# Patient Record
Sex: Male | Born: 1965 | Race: Black or African American | Hispanic: No | Marital: Married | Smoking: Never smoker
Health system: Southern US, Community
[De-identification: ages and names within clinical notes are randomized; demographics above are authoritative.]

## PROBLEM LIST (undated history)

## (undated) DIAGNOSIS — F32A Depression, unspecified: Secondary | ICD-10-CM

## (undated) DIAGNOSIS — R319 Hematuria, unspecified: Secondary | ICD-10-CM

## (undated) DIAGNOSIS — F329 Major depressive disorder, single episode, unspecified: Secondary | ICD-10-CM

## (undated) DIAGNOSIS — D649 Anemia, unspecified: Secondary | ICD-10-CM

## (undated) DIAGNOSIS — N200 Calculus of kidney: Secondary | ICD-10-CM

## (undated) HISTORY — DX: Anemia, unspecified: D64.9

## (undated) HISTORY — DX: Hematuria, unspecified: R31.9

## (undated) HISTORY — DX: Major depressive disorder, single episode, unspecified: F32.9

## (undated) HISTORY — DX: Depression, unspecified: F32.A

## (undated) HISTORY — PX: CHOLECYSTECTOMY: SHX55

## (undated) HISTORY — DX: Calculus of kidney: N20.0

---

## 1998-06-25 ENCOUNTER — Emergency Department (HOSPITAL_COMMUNITY): Admission: EM | Admit: 1998-06-25 | Discharge: 1998-06-25 | Payer: Self-pay | Admitting: Emergency Medicine

## 2000-11-11 ENCOUNTER — Emergency Department (HOSPITAL_COMMUNITY): Admission: EM | Admit: 2000-11-11 | Discharge: 2000-11-11 | Payer: Self-pay | Admitting: Emergency Medicine

## 2002-01-26 ENCOUNTER — Emergency Department (HOSPITAL_COMMUNITY): Admission: EM | Admit: 2002-01-26 | Discharge: 2002-01-26 | Payer: Self-pay | Admitting: Emergency Medicine

## 2005-01-07 ENCOUNTER — Emergency Department (HOSPITAL_COMMUNITY): Admission: EM | Admit: 2005-01-07 | Discharge: 2005-01-07 | Payer: Self-pay | Admitting: Emergency Medicine

## 2007-01-16 ENCOUNTER — Ambulatory Visit: Payer: Self-pay | Admitting: Internal Medicine

## 2007-01-16 DIAGNOSIS — F528 Other sexual dysfunction not due to a substance or known physiological condition: Secondary | ICD-10-CM | POA: Insufficient documentation

## 2007-01-20 LAB — CONVERTED CEMR LAB
ALT: 22 units/L (ref 0–53)
AST: 25 units/L (ref 0–37)
Albumin: 3.9 g/dL (ref 3.5–5.2)
Alkaline Phosphatase: 66 units/L (ref 39–117)
BUN: 7 mg/dL (ref 6–23)
Basophils Absolute: 0 10*3/uL (ref 0.0–0.1)
Basophils Relative: 0.4 % (ref 0.0–1.0)
Bilirubin, Direct: 0.3 mg/dL (ref 0.0–0.3)
CO2: 33 meq/L — ABNORMAL HIGH (ref 19–32)
Calcium: 9.7 mg/dL (ref 8.4–10.5)
Chloride: 101 meq/L (ref 96–112)
Cholesterol: 148 mg/dL (ref 0–200)
Creatinine, Ser: 0.8 mg/dL (ref 0.4–1.5)
Eosinophils Absolute: 0.2 10*3/uL (ref 0.0–0.6)
Eosinophils Relative: 3 % (ref 0.0–5.0)
GFR calc Af Amer: 138 mL/min
GFR calc non Af Amer: 114 mL/min
Glucose, Bld: 94 mg/dL (ref 70–99)
H Pylori IgG: POSITIVE — AB
HCT: 42.2 % (ref 39.0–52.0)
HDL: 40.2 mg/dL (ref >39.0)
Hemoglobin: 15.1 g/dL (ref 13.0–17.0)
LDL Cholesterol: 82 mg/dL (ref 0–99)
Lymphocytes Relative: 32 % (ref 12.0–46.0)
MCHC: 35.8 g/dL (ref 30.0–36.0)
MCV: 90.6 fL (ref 78.0–100.0)
Monocytes Absolute: 0.6 10*3/uL (ref 0.2–0.7)
Monocytes Relative: 8.7 % (ref 3.0–11.0)
Neutro Abs: 3.8 10*3/uL (ref 1.4–7.7)
Neutrophils Relative %: 55.9 % (ref 43.0–77.0)
Platelets: 464 10*3/uL — ABNORMAL HIGH (ref 150–400)
Potassium: 4.1 meq/L (ref 3.5–5.1)
RBC: 4.66 M/uL (ref 4.22–5.81)
RDW: 12.4 % (ref 11.5–14.6)
Sodium: 139 meq/L (ref 135–145)
Testosterone: 618.88 ng/dL (ref 350.00–890)
Total Bilirubin: 1.6 mg/dL — ABNORMAL HIGH (ref 0.3–1.2)
Total CHOL/HDL Ratio: 3.7
Total Protein: 6.5 g/dL (ref 6.0–8.3)
Triglycerides: 131 mg/dL (ref 0–149)
VLDL: 26 mg/dL (ref 0–40)
WBC: 6.8 10*3/uL (ref 4.5–10.5)

## 2008-01-21 ENCOUNTER — Ambulatory Visit: Payer: Self-pay | Admitting: Internal Medicine

## 2009-08-28 ENCOUNTER — Encounter: Payer: Self-pay | Admitting: Internal Medicine

## 2009-08-29 ENCOUNTER — Ambulatory Visit: Payer: Self-pay | Admitting: Internal Medicine

## 2009-08-29 DIAGNOSIS — R319 Hematuria, unspecified: Secondary | ICD-10-CM

## 2009-08-29 HISTORY — DX: Hematuria, unspecified: R31.9

## 2009-08-29 LAB — CONVERTED CEMR LAB
ALT: 32 units/L (ref 0–53)
Albumin: 4.1 g/dL (ref 3.5–5.2)
Basophils Absolute: 0 10*3/uL (ref 0.0–0.1)
Calcium: 9.1 mg/dL (ref 8.4–10.5)
Eosinophils Relative: 2.4 % (ref 0.0–5.0)
GFR calc non Af Amer: 119.23 mL/min (ref >60)
HCT: 40.3 % (ref 39.0–52.0)
Hemoglobin: 14.3 g/dL (ref 13.0–17.0)
Lymphocytes Relative: 37.1 % (ref 12.0–46.0)
MCV: 90.9 fL (ref 78.0–100.0)
Monocytes Relative: 8.5 % (ref 3.0–12.0)
Neutrophils Relative %: 51.5 % (ref 43.0–77.0)
Potassium: 4.3 meq/L (ref 3.5–5.1)
RBC: 4.43 M/uL (ref 4.22–5.81)
RDW: 13 % (ref 11.5–14.6)
Total Bilirubin: 1.2 mg/dL (ref 0.3–1.2)
WBC: 4.8 10*3/uL (ref 4.5–10.5)

## 2009-08-31 ENCOUNTER — Ambulatory Visit: Payer: Self-pay | Admitting: Cardiology

## 2009-09-07 ENCOUNTER — Ambulatory Visit: Payer: Self-pay | Admitting: Internal Medicine

## 2009-09-07 LAB — CONVERTED CEMR LAB
Cholesterol: 187 mg/dL (ref 0–200)
LDL Cholesterol: 134 mg/dL — ABNORMAL HIGH (ref 0–99)
Total CHOL/HDL Ratio: 4

## 2009-09-08 LAB — CONVERTED CEMR LAB

## 2009-10-13 ENCOUNTER — Ambulatory Visit: Payer: Self-pay | Admitting: Internal Medicine

## 2009-10-17 ENCOUNTER — Encounter (INDEPENDENT_AMBULATORY_CARE_PROVIDER_SITE_OTHER): Payer: Self-pay | Admitting: *Deleted

## 2009-10-25 ENCOUNTER — Telehealth: Payer: Self-pay | Admitting: Internal Medicine

## 2009-10-27 ENCOUNTER — Ambulatory Visit: Payer: Self-pay | Admitting: Gastroenterology

## 2009-10-27 DIAGNOSIS — F329 Major depressive disorder, single episode, unspecified: Secondary | ICD-10-CM

## 2009-11-03 ENCOUNTER — Telehealth: Payer: Self-pay | Admitting: Gastroenterology

## 2009-11-06 ENCOUNTER — Ambulatory Visit: Payer: Self-pay | Admitting: Gastroenterology

## 2009-11-07 LAB — HM COLONOSCOPY

## 2009-11-10 ENCOUNTER — Telehealth: Payer: Self-pay | Admitting: Gastroenterology

## 2010-01-12 ENCOUNTER — Telehealth: Payer: Self-pay | Admitting: Internal Medicine

## 2010-01-16 ENCOUNTER — Ambulatory Visit: Payer: Self-pay | Admitting: Internal Medicine

## 2010-02-06 ENCOUNTER — Ambulatory Visit: Payer: Self-pay | Admitting: Psychology

## 2010-03-02 ENCOUNTER — Ambulatory Visit: Payer: Self-pay | Admitting: Internal Medicine

## 2010-03-09 ENCOUNTER — Ambulatory Visit: Payer: Self-pay | Admitting: Psychology

## 2010-04-19 ENCOUNTER — Emergency Department (HOSPITAL_COMMUNITY): Admission: EM | Admit: 2010-04-19 | Discharge: 2009-12-22 | Payer: Self-pay | Admitting: Emergency Medicine

## 2010-05-26 ENCOUNTER — Emergency Department (HOSPITAL_COMMUNITY)
Admission: EM | Admit: 2010-05-26 | Discharge: 2010-05-26 | Payer: Self-pay | Source: Home / Self Care | Admitting: *Deleted

## 2010-06-14 NOTE — Progress Notes (Signed)
Summary: Procedure Mon question about prep  Phone Note Call from Patient Call back at [phone]   Call For: Dr Christella Hartigan Summary of Call: Question about Moviprep  Initial call taken by: Leanor Kail [facility],  November 03, 2009 1:48 PM  Follow-up for Phone Call        Pt asking why he has only one bottle with his prep-- states he has 2 pkts A and B but only one bottle to mix it in-- intructed thats correct-- mix one A and one B in the bottle and drink starting at 5 pm sunday and then mix one A and B in the same empty bottle monday starting at 10 am.... both to be followed by 16 oz fluid but nothing red or purple..... 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

## 2010-06-14 NOTE — Procedures (Signed)
Summary: Upper Endoscopy  Patient: Schneider Warchol Note: All result statuses are Final unless otherwise noted.  Tests: (1) Upper Endoscopy (EGD)   EGD Upper Endoscopy       DONE     [facility]     [address]     [place], [place]  [address]           ENDOSCOPY PROCEDURE REPORT           PATIENT:  Clee, Pandit  MR#:  [id number]     BIRTHDATE:  Dec 31, 1965, 43 yrs. old  GENDER:  male     ENDOSCOPIST:  Rachael Fee, MD     PROCEDURE DATE:  11/06/2009     PROCEDURE:  EGD, diagnostic     ASA CLASS:  Class II     INDICATIONS:  dyspepsia     MEDICATIONS:  There was residual sedation effect present from     prior procedure., Versed 2 mg IV     TOPICAL ANESTHETIC:  Exactacain Spray     DESCRIPTION OF PROCEDURE:   After the risks benefits and     alternatives of the procedure were thoroughly explained, informed     consent was obtained.  The LB [id number]-H180 [id number] endoscope was     introduced through the mouth and advanced to the second portion of     the duodenum, without limitations.  The instrument was slowly     withdrawn as the mucosa was fully examined.     <<PROCEDUREIMAGES>>     The upper, middle, and distal third of the esophagus were     carefully inspected and no abnormalities were noted. The z-line     was well seen at the GEJ. The endoscope was pushed into the fundus     which was normal including a retroflexed view. The antrum,gastric     body, first and second part of the duodenum were unremarkable (see     image1, image2, image3, image4, and image6).    Retroflexed views     revealed no abnormalities.    The scope was then withdrawn from     the patient and the procedure completed.           COMPLICATIONS:  None           ENDOSCOPIC IMPRESSION:     1) Normal EGD     2) Symptoms are likely from GERD +/- non-ulcer dyspepsia.  These     have clearly improved on twice daily nexium and were improving on     once dialy prilosec.           RECOMMENDATIONS:  A prescription for nexium was called in. Take one pill 20-30 min     before breakfast or dinner meal, daily. After 3-4 months, try     cutting back to one pill every other day.     Call Dr. Christella Hartigan' office if symptoms return.           ______________________________     Rachael Fee, MD           cc: Birdie Sons, MD           n.     eSIGNED:   Rachael Fee at 11/06/2009 03:21 PM           Mardelle Matte, [id number]  Note: An exclamation mark (!) indicates a result that was not dispersed into the flowsheet. Document Creation Date: 11/06/2009 3:21 PM _______________________________________________________________________  (1) Order  result status: Final Collection or observation date-time: 11/06/2009 15:13 Requested date-time:  Receipt date-time:  Reported date-time:  Referring Physician:   Ordering Physician: Rob Bunting [phone]) Specimen Source:  Source: Launa Grill Order Number: [id number] Lab site:   Appended Document: Upper Endoscopy    Clinical Lists Changes

## 2010-06-14 NOTE — Assessment & Plan Note (Signed)
Summary: severe abdominal pain and rectal bleeding x 1 week/pl   History of Present Illness Visit Type: Initial Visit Primary GI MD: Rob Bunting MD Primary Provider: Birdie Sons, MD Chief Complaint: Patient c/o 2-3 weeks brbpr with bowel movements as well as blood in the stool. He also c/o frequent generalized sharp abdominal pain. He has some constipation but states this has gotten better since beginning stool softeners. He denies any upper GI symptoms. History of Present Illness:   Terry Tanner 45 YO MALE NEW TO GI TODAY. HE COMES IN WITH C/O SHARP UPPER ABDOMINAL PAINS OVER THE PAST 2 MONTHS. HE HAS BEEN SEEN BY DR. Nelson Chimes HAVE BEEN UNREMARKABLE,AND CT ABD/PELVIS 421/11  SHOWED TINY HYPODENSITIES IN THE LEFT KIDNEY,ONE HYPODENSE STRUCTURE IN THE RIGHT LIVER TOO SMALL TO CHARACTERIZE AND A 3.6 MM PULMONARY NODULE. HE HAS BEEN TAKING PRILOSEC ONC EDAILY WITH CHANGE IN SXS. HE SAYS EATING WILL HELP FOR A LITTLE WHILE THEN THE PAIN COMES RIGHT BACK. NO N/V,WEIGHT DOWN A FEW POUNDS. NO FEVERS. HE IS S/P REMOTE CHOLECYSTECTOMY. NO HEARBURN,DYSPHAGIA. NO ASA/NSAID USE, NO ETOH. HE HAS ALSO BEEN SEEING BRB WITH BM'-BOTH IN THE COMMODE AND MIXED IN WITH HIS STOOLS. NO RECTAL PAIN,HAS HAD AN EXTERNAL HEMORRHOID. BM'S FAIRLY REGULAR. HE HAS BEEN SEEING BLOOD FOR 2 MONTHS AS WELL.   GI Review of Systems    Reports abdominal pain, bloating, and  weight loss.     Location of  Abdominal pain: upper abdomen. Weight loss of FEW  pounds   Denies acid reflux, chest pain, dysphagia with liquids, dysphagia with solids, heartburn, loss of appetite, nausea, vomiting, and  vomiting blood.      Reports change in bowel habits and  rectal bleeding.     Denies anal fissure, black tarry stools, constipation, diarrhea, diverticulosis, fecal incontinence, heme positive stool, hemorrhoids, irritable bowel syndrome, jaundice, light color stool, liver problems, and  rectal pain. Preventive Screening-Counseling &  Management  Caffeine-Diet-Exercise     Does Patient Exercise: no      Drug Use:  no.      Current Medications (verified): 1)  Omeprazole 20 Mg Cpdr (Omeprazole) .... 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  Drug Use:  no Does Patient Exercise:  no  Review of Systems       The patient complains of confusion, depression-new, sleeping problems, and urination - excessive.  The patient denies allergy/sinus, anemia, anxiety-new, arthritis/joint pain, back pain, blood in urine, breast changes/lumps, change in vision, cough, coughing up blood, fainting, fatigue, fever, headaches-new, hearing problems, heart murmur, heart rhythm changes, itching, menstrual pain, muscle pains/cramps, night sweats, nosebleeds, pregnancy symptoms, shortness of breath, skin rash, sore throat, swelling of feet/legs, swollen lymph glands, thirst - excessive , urination - excessive , urine leakage, vision changes, and voice change.         ROS OTHERWISE AS IN HPI  Vital Signs:  Patient profile:   45 year old male Height:      67 inches Weight:      159.38 pounds  Vitals Entered By: Lamona Curl  CMA Duncan Dull) (October 27, 2009 9:34 AM)  Physical Exam  General:  Well developed, well nourished, no acute distress. Head:  Normocephalic and atraumatic. Eyes:  PERRLA, no icterus. Lungs:  Clear throughout to auscultation. Heart:  Regular rate and rhythm; no murmurs, rubs,  or bruits. Abdomen:  SOFT, TENDER  EPIGASTRIUM AND MID ABDOMEN, NO GUARDING, NO REBOUND, NO MASS OR HSM,BS+ Rectal:  EXTERNAL NONINFLAMED HEMORRHOID,STOOL BROWN, HEME NEGATIVE CURRENTLY. Extremities:  No clubbing, cyanosis, edema or deformities noted. Neurologic:  Alert and  oriented x4;  grossly normal neurologically. Psych:  Alert and cooperative. Normal mood and affect.   Impression & Recommendations:  Problem # 1:  ABDOMINAL PAIN, EPIGASTRIC (ICD-789.06) Assessment New 45 YO MALE WITH 2 MONTH HX OF UPPER ABDOMINAL PAINS. PT IS S/P CHOLECYSTECTOMY-RECENT NORMAL LABS AND CT. R/O PUD/GASTROPATHY,H.PYLORI.  HE ALSO HAS BRB PER RECTUM /MIXED WITH STOOL X 2 MONTHS. R/O HEMORHOIDAL,R/O OCCULT LESION./R/O IBD   TRIAL OF NEXIUM 40 MG TWICE DAILY SCHEDULE FOR EGD AND COLONOSCOPY WITH DR. Christella Hartigan. PROCEDURES DISCUSSED IN DETAIL WITH PT AND HE IS AGREEABLE. BLAND DIET Orders: Colon/Endo (Colon/Endo)  Problem # 2:  RECTAL BLEEDING (ICD-569.3) Assessment: Comment Only SEE ABOVE Orders: Colon/Endo (Colon/Endo)  Problem # 3:  PULMONARY NODULE Assessment: Comment Only FOLLOW UP DEFERRED TO PRIMARY MD  Patient Instructions: 1)  We scheduled the procedure with Dr. Christella Hartigan on 11-06-09;. 2)  [facility] Patient Information Guide given to patient. 3)  Directions and brochure given. 4)  We have given you samples of Nexium 40 mg, take 1 cap 30 min prior to breakfast.   5)  [facility] [address] 6)  Copy sent to : Birdie Sons, MD 7)  The medication list was reviewed and reconciled.  All changed / newly prescribed medications were explained.  A complete medication list was provided to the patient / caregiver. Prescriptions: MOVIPREP 100 GM  SOLR (PEG-KCL-NACL-NASULF-NA ASC-C) As per prep instructions.  #1 x 0   Entered by:   Lowry Ram NCMA   Authorized by:   Sammuel Cooper PA-c   Signed by:   Lowry Ram NCMA on 10/27/2009   Method used:   [facility] to        [facility]  [facility] [phone]* (retail)       [address] [place], [place]  [address]       Ph: [phone]       Fax: [phone]   RxID:   [id number]

## 2010-06-14 NOTE — Assessment & Plan Note (Signed)
Summary: 6 wk rov/njr   Vital Signs:  Patient profile:   45 year old male Weight:      162 pounds Temp:     98 degrees F oral Pulse rate:   78 / minute Pulse rhythm:   regular Resp:     12 per minute BP sitting:   118 / 90  Vitals Entered By: Lynann Beaver CMA AAMA (March 02, 2010 8:12 AM) CC: rov Is Patient Diabetic? No Pain Assessment Patient in pain? no        Primary Care Provider:  Birdie Sons, MD  CC:  rov.  History of Present Illness: depression: much improved. not quite as energetic as he would like. he is able to handle stressors better he is here with wife---she reports the same he feels that he is thinking "sharper"  no side effects on meds  Current Problems (verified): 1)  Depression  (ICD-311) 2)  Preventive Health Care  (ICD-V70.0) 3)  Hematuria  (ICD-599.70) 4)  Erectile Dysfunction  (ICD-302.72)  Current Medications (verified): 1)  Citalopram Hydrobromide 20 Mg Tabs (Citalopram Hydrobromide) .... 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  no illicit drugs  Risk Factors: Exercise: no (10/27/2009)  Risk Factors: Smoking Status: never (09/07/2009)  Physical Exam  General:  alert and well-developed.   Psych:  normally interactive, good eye contact, not anxious appearing, and not depressed appearing.     Impression &  Recommendations:  Problem # 1:  DEPRESSION (ICD-311) will increase meds side effects discussed see me 6 weeks His updated medication list for this problem includes:    Citalopram Hydrobromide 20 Mg Tabs (Citalopram hydrobromide) ..... One by mouth daily  Complete Medication List: 1)  Citalopram Hydrobromide 40 Mg Tabs (Citalopram hydrobromide) .... Take one tablet by mouth daily  Patient Instructions: 1)  see me 6 weeks  Prescriptions: CITALOPRAM HYDROBROMIDE 40 MG TABS (CITALOPRAM HYDROBROMIDE) take one tablet by mouth daily  #90 x 3   Entered and Authorized by:   Birdie Sons MD   Signed by:   Birdie Sons MD on 03/02/2010   Method used:   [phone]       [address]       [place], [place]  [address]       Ph: [phone] or [phone]       Fax: [phone]   RxID:   [id number] CITALOPRAM HYDROBROMIDE 40 MG TABS (CITALOPRAM HYDROBROMIDE) take one tablet by mouth daily  #90 x 3   Entered and Authorized by:   Birdie Sons MD   Signed by:   Birdie Sons MD on 03/02/2010   Method used:   Electronically to        [facility] # [phone]* (retail)       [address] [place], [place]  [address]       Ph: [phone]  Fax: [phone]   RxID:   [id number]    Orders Added: 1)  Est. Patient Level III [[address]]

## 2010-06-14 NOTE — Assessment & Plan Note (Signed)
Summary: cpx//ccm   Vital Signs:  Patient profile:   45 year old male Height:      67 inches Weight:      170 pounds BMI:     26.72 Pulse rate:   72 / minute Pulse rhythm:   regular Resp:     12 per minute BP sitting:   116 / 64  (left arm) Cuff size:   regular  Vitals Entered By: Gladis Riffle, RN (September 07, 2009 10:17 AM) CC: cpx, labs done--c/o stomach pains under diaphragm x 2-3 weeks--discuss BP, was 150/? at CT scan Is Patient Diabetic? No   CC:  cpx, labs done--c/o stomach pains under diaphragm x 2-3 weeks--discuss BP, and was 150/? at CT scan.  History of Present Illness: cpx note hematuria---urology  has some abdominal pain--ongoing for years nl BMs not affected by foods  Preventive Screening-Counseling & Management  Alcohol-Tobacco     Smoking Status: never  Current Medications (verified): 1)  None  Allergies (verified): No Known Drug Allergies  Family History: father healthy mother --- deceased cancer alcohol related (liver cancer)  Social History: Occupation: truck Hospital doctor  Never Smoked Alcohol use-no 4 year relationship 2 kids healthy  Physical Exam  General:  alert and well-developed.   Head:  normocephalic and atraumatic.   Eyes:  pupils equal and pupils round.   Ears:  R ear normal and L ear normal.   Nose:  no external deformity and no external erythema.   Mouth:  Oral mucosa and oropharynx without lesions or exudates.  Teeth in good repair. Neck:  supple, full ROM, and no masses.   Chest Wall:  no deformities and no tenderness.   Lungs:  Normal respiratory effort, chest expands symmetrically. Lungs are clear to auscultation, no crackles or wheezes. Heart:  normal rate and regular rhythm.   Abdomen:  Bowel sounds positive,abdomen soft and non-tender without masses, organomegaly or hernias noted. Msk:  No deformity or scoliosis noted of thoracic or lumbar spine.   Extremities:  No clubbing, cyanosis, edema, or deformity noted    Neurologic:  cranial nerves II-XII intact and gait normal.   Skin:  turgor normal and color normal.   Psych:  normally interactive and good eye contact.     Impression & Recommendations:  Problem # 1:  PREVENTIVE HEALTH CARE (ICD-V70.0)  Orders: Venipuncture ([address]) T-RPR (Syphilis) ([address]) T-HIV Antibody  (Reflex) ([address]78-29[address]) TLB-Lipid Panel (80061-LIPID)  Problem # 2:  HEMATURIA (ICD-599.70) to see urology  Problem # 3:  ABDOMINAL PAIN (ICD-789.00) ongoing problem triall ppi if no success then GI appt  Complete Medication List: 1)  Omeprazole 20 Mg Cpdr (Omeprazole) .... One by mouth daily  Other Orders: Tdap => 78yrs IM ([address]86) Admin 1st Vaccine ([address]) Prescriptions: OMEPRAZOLE 20 MG CPDR (OMEPRAZOLE) one by mouth daily  #30 x 3   Entered and Authorized by:   Birdie Sons MD   Signed by:   Birdie Sons MD on 09/07/2009   Method used:   [facility] to        [facility] [phone]* (retail)       [address] [place], [place]  [address]       Ph: [phone]       Fax: [phone]   RxID:   [id number] OMEPRAZOLE 20 MG CPDR (OMEPRAZOLE) one by mouth daily  #30 x 3   Entered and Authorized by:   Birdie Sons MD   Signed by:  Birdie Sons MD on 09/07/2009   Method used:   [facility]        [facility]  [address] [phone]* (retail)       [address]       [place], [place]  [address]       Ph: [phone] or [phone]       Fax: [phone]   RxID:   [id number]    Immunizations Administered:  Tetanus Vaccine:    Vaccine Type: Tdap    Site: left deltoid    Mfr: GlaxoSmithKline    Dose: 0.5 ml    Route: IM    Given by: Gladis Riffle, RN    Exp. Date: 08/05/2011    Lot #: [id number]

## 2010-06-14 NOTE — Miscellaneous (Signed)
Summary: rx  Clinical Lists Changes  Medications: Changed medication from NEXIUM 40 MG CPDR (ESOMEPRAZOLE MAGNESIUM) Take 1 cap 30 min prior to breakfast to NEXIUM 40 MG CPDR (ESOMEPRAZOLE MAGNESIUM) Take 1 cap 30 min prior to breakfast meal - Signed Rx of NEXIUM 40 MG CPDR (ESOMEPRAZOLE MAGNESIUM) Take 1 cap 30 min prior to breakfast meal;  #30 x 11;  Signed;  Entered by: Rachael Fee MD;  Authorized by: Rachael Fee MD;  Method used: [facility] # [phone]*, [address] [place], [place], [place]  [address], Ph: [phone], Fax: [phone]    Prescriptions: NEXIUM 40 MG CPDR (ESOMEPRAZOLE MAGNESIUM) Take 1 cap 30 min prior to breakfast meal  #30 x 11   Entered and Authorized by:   Rachael Fee MD   Signed by:   Rachael Fee MD on 11/06/2009   Method used:   Electronically to        [facility] [phone]* (retail)       [address] [place], [place]  [address]       Ph: [phone]       Fax: [phone]   RxID:   [id number]

## 2010-06-14 NOTE — Letter (Signed)
Summary: New Patient letter  [facility]  [address] [place], [place] [address]   Phone: [phone]  Fax: [phone]       10/17/2009 MRN: [id number]  Terry Tanner [address] [place], [place]  [address]  Dear Mr. Terry Tanner,  Welcome to the Gastroenterology Division at [facility].    You are scheduled to see Dr.  Leone Payor on 11-06-09 at 8:45am on the 3rd floor at [facility], [address]  We ask that you try to arrive at our office 15 minutes prior to your appointment time to allow for check-in.  We would like you to complete the enclosed self-administered evaluation form prior to your visit and bring it with you on the day of your appointment.  We will review it with you.  Also, please bring a complete list of all your medications or, if you prefer, bring the medication bottles and we will list them.  Please bring your insurance card so that we may make a copy of it.  If your insurance requires a referral to see a specialist, please bring your referral form from your primary care physician.  Co-payments are due at the time of your visit and may be paid by cash, check or credit card.     Your office visit will consist of a consult with your physician (includes a physical exam), any laboratory testing he/she may order, scheduling of any necessary diagnostic testing (e.g. x-ray, ultrasound, CT-scan), and scheduling of a procedure (e.g. Endoscopy, Colonoscopy) if required.  Please allow enough time on your schedule to allow for any/all of these possibilities.    If you cannot keep your appointment, please call [phone] to cancel or reschedule prior to your appointment date.  This allows [place] the opportunity to schedule an appointment for another patient in need of care.  If you do not cancel or reschedule by 5 p.m. the business day prior to your appointment date, you will be charged a $50.00 late cancellation/no-show fee.    Thank you for  choosing [facility] for your medical needs.  We appreciate the opportunity to care for you.  Please visit [place] at our website  to learn more about our practice.                     Sincerely,                                                             The Gastroenterology Division

## 2010-06-14 NOTE — Procedures (Signed)
Summary: Colonoscopy  Patient: Terry Tanner Note: All result statuses are Final unless otherwise noted.  Tests: (1) Colonoscopy (COL)   COL Colonoscopy           DONE     [facility]     [address]     [place], [place]  [address]           COLONOSCOPY PROCEDURE REPORT           PATIENT:  Terry Tanner, Terry Tanner  MR#:  [id number]     BIRTHDATE:  1966-01-22, 43 yrs. old  GENDER:  male     ENDOSCOPIST:  Rachael Fee, MD     REF. BY:  Birdie Sons, M.D.     PROCEDURE DATE:  11/06/2009     PROCEDURE:  Diagnostic Colonoscopy     ASA CLASS:  Class II     INDICATIONS:  rectal bleeding     MEDICATIONS:   Fentanyl 75 mcg IV, Versed 8 mg IV           DESCRIPTION OF PROCEDURE:   After the risks benefits and     alternatives of the procedure were thoroughly explained, informed     consent was obtained.  Digital rectal exam was performed and     revealed no rectal masses.   The LB [id number] [id number] endoscope     was introduced through the anus and advanced to the cecum, which     was identified by both the appendix and ileocecal valve, without     limitations.  The quality of the prep was good, using MoviPrep.     The instrument was then slowly withdrawn as the colon was fully     examined.     <<PROCEDUREIMAGES>>           FINDINGS:  External hemorrhoids were found. These were small,     non-thrombosed.  This was otherwise a normal examination of the     colon (see image1, image2, and image3).   Retroflexed views in the     rectum revealed no abnormalities.    The scope was then withdrawn     from the patient and the procedure completed.           COMPLICATIONS:  None           ENDOSCOPIC IMPRESSION:     1) External hemorrhoids; these are likely the source of recent     minor rectal bleeding     2) Otherwise normal examination; no polyps or cancers           RECOMMENDATIONS:     1) Continue current colorectal screening recommendations for     "routine risk" patients with a  repeat colonoscopy iat age 64.           REPEAT EXAM:  9 years (age 28)           ______________________________     Rachael Fee, MD           n.     eSIGNED:   Rachael Fee at 11/06/2009 03:08 PM           Mardelle Matte, [id number]  Note: An exclamation mark (!) indicates a result that was not dispersed into the flowsheet. Document Creation Date: 11/06/2009 3:09 PM _______________________________________________________________________  (1) Order result status: Final Collection or observation date-time: 11/06/2009 15:04 Requested date-time:  Receipt date-time:  Reported date-time:  Referring Physician:   Ordering Physician: Rob Bunting [id number]) Specimen  Source:  Source: Launa Grill Order Number: [id number] Lab site:   Appended Document: Colonoscopy     Procedures Next Due Date:    Colonoscopy: 11/2017

## 2010-06-14 NOTE — Letter (Signed)
Summary: [facility]  [facility]   Imported By: Maryln Gottron 08/30/2009 14:16:58  _____________________________________________________________________  External Attachment:    Type:   Image     Comment:   External Document

## 2010-06-14 NOTE — Assessment & Plan Note (Signed)
Summary: GI ISSUES OK PER ELLEN/SRC   Vital Signs:  Patient profile:   45 year old male Weight:      165 pounds Temp:     98.0 degrees F oral BP sitting:   120 / 70 CC: GI problems meds not working    CC:  GI problems meds not working .  History of Present Illness: still with some epigastric disomfort pain is constant food does not affect the discomfort BMs are normal Triall PPI without success sxs have been ongoing for 2-4 months.   All other systems reviewed and were negative   Current Medications (verified): 1)  Omeprazole 20 Mg Cpdr (Omeprazole) .... One By Mouth Daily  Allergies (verified): No Known Drug Allergies  Past History:  Past Medical History: Last updated: 01/16/2007 Unremarkable  Past Surgical History: Last updated: 01/16/2007 Cholecystectomy  Family History: Last updated: 09/07/2009 father healthy mother --- deceased cancer alcohol related (liver cancer)  Social History: Last updated: 09/07/2009 Occupation: truck driver  Never Smoked Alcohol use-no 4 year relationship 2 kids healthy  Risk Factors: Smoking Status: never (09/07/2009)  Physical Exam  General:  alert and well-developed.   Head:  normocephalic and atraumatic.   Eyes:  pupils equal and pupils round.   Ears:  R ear normal and L ear normal.   Neck:  supple, full ROM, and no masses.   Chest Wall:  no deformities and no tenderness.   Lungs:  Normal respiratory effort, chest expands symmetrically. Lungs are clear to auscultation, no crackles or wheezes. Heart:  normal rate and regular rhythm.   Abdomen:  Bowel sounds positive,abdomen soft and non-tender without masses, organomegaly or hernias noted. Msk:  No deformity or scoliosis noted of thoracic or lumbar spine.   Neurologic:  cranial nerves II-XII intact and gait normal.     Impression & Recommendations:  Problem # 1:  ABDOMINAL PAIN (ICD-789.00)  refer to GI--- probably needs colonoscopy  Orders: Venipuncture  ([address]) T-Sprue Panel (Celiac Disease Aby Eval) (83516x3/[address]) Gastroenterology Referral (GI) TLB-Sedimentation Rate (ESR) (85652-ESR)  Complete Medication List: 1)  Omeprazole 20 Mg Cpdr (Omeprazole) .... One by mouth daily

## 2010-06-14 NOTE — Progress Notes (Signed)
Summary: Triage  Phone Note Call from Patient Call back at Home Phone [phone]   Caller: spouse Saipine Call For: Dr. Leone Payor Summary of Call: pt has an appt. on 11-06-09 and requesting sooner appt. Pt is having severe abd. pain Initial call taken by: Karna Christmas,  October 25, 2009 3:35 PM  Follow-up for Phone Call        pt has had sever abd pain x 1 week with BRB on tissue and in the commode.  The pain has gotten worse.  Pt offered and accepted appt with Amy on Fri. Follow-up by: Chales Abrahams CMA Duncan Dull),  October 25, 2009 3:46 PM

## 2010-06-14 NOTE — Assessment & Plan Note (Signed)
Summary: ? possible urine issues per Dr Naomie Dean   Vital Signs:  Patient profile:   45 year old male Weight:      172 pounds BMI:     27.04 Temp:     98.2 degrees F oral Pulse rate:   84 / minute Pulse rhythm:   regular Resp:     12 per minute BP sitting:   104 / 72  (left arm) Cuff size:   regular  Vitals Entered By: Gladis Riffle, RN (August 29, 2009 10:20 AM) CC: at DOT physical was told has hematuria Is Patient Diabetic? No   CC:  at DOT physical was told has hematuria.  History of Present Illness: DOT physical noted blood in urine (reviewed note by Dr. Naomie Dean).  Pt notes fatigue and has had some vague discomfort of abdomen (described as bloating)---reviewed record. pt admits to daytime hypersomnolence. 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  he has never had gross hematuria.  All other systems reviewed and were negative   Preventive Screening-Counseling & Management  Alcohol-Tobacco     Smoking Status: never  Current Medications (verified): 1)  None  Allergies (verified): No Known Drug Allergies  Past History:  Past Medical History: Last updated: 01/16/2007 Unremarkable  Past Surgical History: Last updated: 01/16/2007 Cholecystectomy  Family History: Last updated: 01/16/2007 father healthy mother healthy  Social History: Last updated: 01/16/2007 Occupation: truck driver  Never Smoked Alcohol use-no 9 year relationship  Risk Factors: Smoking Status: never (08/29/2009)  Physical Exam  General:  alert and well-developed.   Head:  normocephalic and atraumatic.   Eyes:  pupils equal and pupils round.   Ears:  R ear normal and L ear normal.   Neck:  supple, full ROM, and no masses.   Chest Wall:  no deformities and no tenderness.   Lungs:  normal respiratory effort and no intercostal retractions.   Heart:  normal rate and regular rhythm.   Abdomen:  Bowel sounds positive,abdomen soft  and non-tender without masses, organomegaly or hernias noted. Msk:  No deformity or scoliosis noted of thoracic or lumbar spine.   Extremities:  No clubbing, cyanosis, edema, or deformity noted  Neurologic:  cranial nerves II-XII intact and gait normal.     Impression & Recommendations:  Problem # 1:  HEMATURIA (ICD-599.70) needs eval] check labs check CT abdomen and pelvis Orders: T-Culture, Urine ([id number]) Radiology Referral (Radiology) Venipuncture (57846) TLB-BMP (Basic Metabolic Panel-BMET) (80048-METABOL) TLB-CBC Platelet - w/Differential (85025-CBCD) TLB-TSH (Thyroid Stimulating Hormone) (84443-TSH) TLB-Hepatic/Liver Function Pnl (80076-HEPATIC)  Problem # 2:  ABDOMINAL PAIN (ICD-789.00) ? cause will evaluate as part of hematuria eval Orders: Radiology Referral (Radiology) Venipuncture [phone]) TLB-BMP (Basic Metabolic Panel-BMET) (80048-METABOL) TLB-CBC Platelet - w/Differential (85025-CBCD) TLB-TSH (Thyroid Stimulating Hormone) (84443-TSH) TLB-Hepatic/Liver Function Pnl (80076-HEPATIC)   Pt tells me that hysicians responsible for DOT physical have restricted him from working until evaluation complete (we will try to expedite eval, but DOT physicians will be responsible for writing notes to work, etc...)  Complete Medication List: 1)  None   Other Orders: UA Dipstick w/o Micro (automated)  (81003)  Appended Document: Orders Update    Clinical Lists Changes  Observations: Added new observation of COMMENTS: Wynona Canes, CMA  August 29, 2009 1:51 PM  (08/29/2009 13:51) Added new observation of PH URINE: 6.0  (08/29/2009 13:51) Added new observation of SPEC GR URIN: <1.005  (08/29/2009 13:51) Added new observation of APPEARANCE  U: Clear  (08/29/2009 13:51) Added new observation of UA COLOR: yellow  (08/29/2009 13:51) Added new observation of WBC DIPSTK U: negative  (08/29/2009 13:51) Added new observation of NITRITE URN: negative  (08/29/2009  13:51) Added new observation of UROBILINOGEN: 0.2  (08/29/2009 13:51) Added new observation of PROTEIN, URN: negative  (08/29/2009 13:51) Added new observation of BLOOD UR DIP: 2+  (08/29/2009 13:51) Added new observation of KETONES URN: negative  (08/29/2009 13:51) Added new observation of BILIRUBIN UR: negative  (08/29/2009 13:51) Added new observation of GLUCOSE, URN: negative  (08/29/2009 13:51)      Laboratory Results   Urine Tests  Date/Time Recieved: August 29, 2009 1:51 PM  Date/Time Reported: August 29, 2009 1:51 PM   Routine Urinalysis   Color: yellow Appearance: Clear Glucose: negative   (Normal Range: Negative) Bilirubin: negative   (Normal Range: Negative) Ketone: negative   (Normal Range: Negative) Spec. Gravity: <1.005   (Normal Range: 1.003-1.035) Blood: 2+   (Normal Range: Negative) pH: 6.0   (Normal Range: 5.0-8.0) Protein: negative   (Normal Range: Negative) Urobilinogen: 0.2   (Normal Range: 0-1) Nitrite: negative   (Normal Range: Negative) Leukocyte Esterace: negative   (Normal Range: Negative)    Comments: Wynona Canes, CMA  August 29, 2009 1:51 PM     Appended Document: ? possible urine issues per Dr Naomie Dean CT pending
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
## 2010-06-14 NOTE — Progress Notes (Signed)
Summary: triage  Phone Note Call from Patient Call back at [phone]   Caller: Patient Call For: Christella Hartigan Reason for Call: Talk to Nurse Summary of Call: Patient states that his hemorroids has flare up wants to know what to do. Initial call taken by: Tawni Levy,  November 10, 2009 9:14 AM  Follow-up for Phone Call        Message left to call back.   Teryl Lucy RN  November 10, 2009 9:22 AM Hemmorrhoids are painful.No rectal bleeding.Advised to use fiber supplement daily.Requesting med. as he tried otc. cream and it has not helped. Follow-up by: Teryl Lucy RN,  November 10, 2009 9:31 AM  Additional Follow-up for Phone Call Additional follow up Details #1::        please call him in analpram ointment, apply twice daily while hemorrhoids are painful, disp 1 large tube with 4 refills.  he should call if this is not helpful Additional Follow-up by: Rachael Fee MD,  November 10, 2009 9:45 AM    Additional Follow-up for Phone Call Additional follow up Details #2::    Rx. sent to pharmacy Follow-up by: Teryl Lucy RN,  November 10, 2009 10:04 AM  New/Updated Medications: ANALPRAM-HC 1-2.5 % CREA (HYDROCORTISONE ACE-PRAMOXINE) Apply topically to rectum b.i.d. when hemorrhoids painful Prescriptions: ANALPRAM-HC 1-2.5 % CREA (HYDROCORTISONE ACE-PRAMOXINE) Apply topically to rectum b.i.d. when hemorrhoids painful  #30 gm. tube x 3   Entered by:   Teryl Lucy RN   Authorized by:   Rachael Fee MD   Signed by:   Teryl Lucy RN on 11/10/2009   Method used:   [facility]ron[facility] to        [phone][facility] (retail)       [address] [place], [place]  [address]       Ph: [phone]       Fax: [phone]   RxID:   [id number]

## 2010-06-14 NOTE — Letter (Signed)
Summary: [facility] Instructions  [facility]  [address] [place], [place] [address]   Phone: [phone]  Fax: [phone]       Terry Tanner    06/03/1965    MRN: [id number]        Procedure Day /Date: 11-06-09     Arrival Time: 2:00 PM      Procedure Time: 3:00 PM     Location of Procedure:                    X      [facility] (4th Floor)                       PREPARATION FOR COLONOSCOPY WITH MOVIPREP   Starting 5 days prior to your procedure 11-01-09 do not eat nuts, seeds, popcorn, corn, beans, peas,  salads, or any raw vegetables.  Do not take any fiber supplements (e.g. Metamucil, Citrucel, and Benefiber).  THE DAY BEFORE YOUR PROCEDURE         DATE: 11-05-09 DAY: Sunday  1.  Drink clear liquids the entire day-NO SOLID FOOD  2.  Do not drink anything colored red or purple.  Avoid juices with pulp.  No orange juice.  3.  Drink at least 64 oz. (8 glasses) of fluid/clear liquids during the day to prevent dehydration and help the prep work efficiently.  CLEAR LIQUIDS INCLUDE: Water Jello Ice Popsicles Tea (sugar ok, no milk/cream) Powdered fruit flavored drinks Coffee (sugar ok, no milk/cream) Gatorade Juice: apple, white grape, white cranberry  Lemonade Clear bullion, consomm, broth Carbonated beverages (any kind) Strained chicken noodle soup Hard Candy                             4.  In the morning, mix first dose of MoviPrep solution:    Empty 1 Pouch A and 1 Pouch B into the disposable container    Add lukewarm drinking water to the top line of the container. Mix to dissolve    Refrigerate (mixed solution should be used within 24 hrs)  5.  Begin drinking the prep at 5:00 p.m. The MoviPrep container is divided by 4 marks.   Every 15 minutes drink the solution down to the next mark (approximately 8 oz) until the full liter is complete.   6.  Follow completed prep with 16 oz of clear liquid of your choice (Nothing red or  purple).  Continue to drink clear liquids until bedtime.  7.  Before going to bed, mix second dose of MoviPrep solution:    Empty 1 Pouch A and 1 Pouch B into the disposable container    Add lukewarm drinking water to the top line of the container. Mix to dissolve    Refrigerate  THE DAY OF YOUR PROCEDURE      DATE: 11-06-09 DAY: Monday  Beginning at 10:00 AM  (5 hours before procedure):         1. Every 15 minutes, drink the solution down to the next mark (approx 8 oz) until the full liter is complete.  2. Follow completed prep with 16 oz. of clear liquid of your choice.    3. You may drink clear liquids until 1:00 PM (2 HOURS BEFORE PROCEDURE).   MEDICATION INSTRUCTIONS  Unless otherwise instructed, you should take regular prescription medications with a small sip of water   as early as possible the  morning of your procedure.      OTHER INSTRUCTIONS  You will need a responsible adult at least 43 years of age to accompany you and drive you home.   This person must remain in the waiting room during your procedure.  Wear loose fitting clothing that is easily removed.  Leave jewelry and other valuables at home.  However, you may wish to bring a book to read or  an iPod/MP3 player to listen to music as you wait for your procedure to start.  Remove all body piercing jewelry and leave at home.  Total time from sign-in until discharge is approximately 2-3 hours.  You should go home directly after your procedure and rest.  You can resume normal activities the  day after your procedure.  The day of your procedure you should not:   Drive   Make legal decisions   Operate machinery   Drink alcohol   Return to work  You will receive specific instructions about eating, activities and medications before you leave.    The above instructions have been reviewed and explained to me by   _______________________    I fully understand and can verbalize these instructions  _____________________________ Date _________
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
## 2010-09-19 ENCOUNTER — Ambulatory Visit (INDEPENDENT_AMBULATORY_CARE_PROVIDER_SITE_OTHER): Payer: BC Managed Care – PPO | Admitting: Internal Medicine

## 2010-09-19 ENCOUNTER — Encounter: Payer: Self-pay | Admitting: Internal Medicine

## 2010-09-19 DIAGNOSIS — R635 Abnormal weight gain: Secondary | ICD-10-CM | POA: Insufficient documentation

## 2010-09-19 DIAGNOSIS — L0390 Cellulitis, unspecified: Secondary | ICD-10-CM

## 2010-09-19 DIAGNOSIS — D649 Anemia, unspecified: Secondary | ICD-10-CM

## 2010-09-19 DIAGNOSIS — L0291 Cutaneous abscess, unspecified: Secondary | ICD-10-CM

## 2010-09-19 DIAGNOSIS — R109 Unspecified abdominal pain: Secondary | ICD-10-CM

## 2010-09-19 HISTORY — DX: Anemia, unspecified: D64.9

## 2010-09-19 LAB — CBC WITH DIFFERENTIAL/PLATELET
Basophils Absolute: 0 10*3/uL (ref 0.0–0.1)
HCT: 40 % (ref 39.0–52.0)
Lymphs Abs: 1.6 10*3/uL (ref 0.7–4.0)
Monocytes Relative: 7.9 % (ref 3.0–12.0)
Neutrophils Relative %: 69.5 % (ref 43.0–77.0)
Platelets: 371 10*3/uL (ref 150.0–400.0)
RDW: 13.8 % (ref 11.5–14.6)

## 2010-09-19 LAB — TSH: TSH: 1.01 u[IU]/mL (ref 0.35–5.50)

## 2010-09-19 LAB — H. PYLORI ANTIBODY, IGG: H Pylori IgG: NEGATIVE

## 2010-09-19 MED ORDER — ESOMEPRAZOLE MAGNESIUM 40 MG PO CPDR: 40.0000 mg | DELAYED_RELEASE_CAPSULE | Freq: Every day | ORAL | Status: DC

## 2010-09-19 NOTE — Assessment & Plan Note (Signed)
Reassuring past workup. Obtain repeat CBC, h. Pylori ab, and begin nexium 40mg  po qd. Followup if no improvement or worsening.

## 2010-09-19 NOTE — Assessment & Plan Note (Signed)
Recommend continuation of abx prescribed by UC. Pt plans to call UC for cx results. If worsens or does not improve then followup closely for consideration of I&D.

## 2010-09-19 NOTE — Assessment & Plan Note (Signed)
Obtain TSH. Consider effect from ssri.

## 2010-09-19 NOTE — Progress Notes (Signed)
  Subjective:    Patient ID: Terry Tanner, male    DOB: December 25, 1965, 45 y.o.   MRN: [id number]  HPI Pt presents to clinic for evaluation of abdominal pain and skin abscess. This recent onset of periumbilical abdominal pain without radiation. No nausea vomiting or change in bowel habits. Has attempted no medications. Previously took Nexium but has been out of medication for one year. No exacerbating or alleviating factors and no relationship to food. Is status post cholecystectomy and underwent abdominal CT April 2011, EGD June 2011-normal and colonoscopy June 2011 with external hemorrhoids noted. Notes associated bloating. Also recently seen three days ago at urgent care for left lower flank skin abscess.  Was placed on unspecified antibiotic. Swab culture was obtained with the current results. Examiner informed patient the area appeared to be likely MRSA. No history of past MRSA or obvious exposure. No fever chills or drainage from the area.Notes unintended wt gain. Does take ssri. Also at [facility] had CBC reviewed with hgb 13.4 (14.1-18.1) No other complaints.  Reviewed past medical history, medications and allergies.  Review of Systems  Constitutional: Positive for unexpected weight change. Negative for fever and chills.  Gastrointestinal: Positive for abdominal pain and abdominal distention. Negative for nausea, vomiting, diarrhea, blood in stool and anal bleeding.  Skin: Positive for color change and wound. Negative for rash.       Objective:   Physical Exam  [nursing notereviewed. Constitutional: He appears well-developed and well-nourished. No distress.  HENT:  Head: Normocephalic and atraumatic.  Right Ear: External ear normal.  Left Ear: External ear normal.  Eyes: Conjunctivae are normal. No scleral icterus.  Abdominal: Soft. He exhibits no distension and no mass. There is no hepatosplenomegaly. There is tenderness in the epigastric area. There is no rebound and no guarding.  Skin: Skin is  warm and dry. No bruising, no burn and no rash noted. He is not diaphoretic. No cyanosis. Nails show no clubbing.          Left lateral flank: 1/2 cm papule. +tender. No expressible discharge. Deeper palpation suggests induration/thickening vs fluid collection under papule ~2cm x 2cm          Assessment & Plan:

## 2010-09-20 ENCOUNTER — Telehealth: Payer: Self-pay

## 2010-09-20 NOTE — Telephone Encounter (Signed)
Message copied by Kyung Rudd on Thu Sep 20, 2010  4:01 PM ------      Message from: Letitia Libra, Maisie Fus      Created: Wed Sep 19, 2010  4:59 PM       Cbc nl. Not anemic. h pylori test neg. Thyroid nl

## 2010-09-21 ENCOUNTER — Telehealth: Payer: Self-pay

## 2010-09-21 NOTE — Telephone Encounter (Signed)
Pt aware and labs mailed, per pt request

## 2010-09-21 NOTE — Telephone Encounter (Signed)
Message copied by Kyung Rudd on Fri Sep 21, 2010  3:22 PM ------      Message from: Letitia Libra, Maisie Fus      Created: Wed Sep 19, 2010  4:59 PM       Cbc nl. Not anemic. h pylori test neg. Thyroid nl

## 2010-09-21 NOTE — Telephone Encounter (Signed)
Pt aware. Per pt request, lab results mailed

## 2010-09-28 NOTE — Assessment & Plan Note (Signed)
[facility]                                 ON-CALL NOTE   JAX, KENTNER                       MRN:          [id number]  DATE:01/20/2007                            DOB:          10/29/1965    A phone call came from Diane with [facility] at [phone].  I wound up speaking  to the pharmacist.  I do not remember his name.  A phone call came at  5:07 p.m. on September 9th.  Mr. Flahive, I guess, was diagnosed with an  H. Pylori infection, and they phoned in a Prevpac for him, but the  directions were one daily for 10 days.  The pharmacist calls because it  is generally a pack for 14 days, with morning pills and evening pills  with the amoxicillin, Biaxin and Prevacid.   PLAN:  I told the pharmacist to go ahead and fill the 14 days of the  Prevpac and to have the patient take it in the usual fashion.  After  that, the patient got on the line, wanted to ask me whether this was  considered transmissible, as his partner has been having some problems.  She had had a little bit of cramps, as well.  I told him that it  generally is not transmissible by sexual or other household means, in a  way that we understand.  I also confirmed for him that it was not in any  way sexually transmitted.     Karie Schwalbe, MD  Electronically Signed    RIL/MedQ  DD: 01/20/2007  DT: 01/21/2007  Job #: [id number]   cc:   Valetta Mole. Swords, MD

## 2010-10-16 ENCOUNTER — Telehealth: Payer: Self-pay | Admitting: Gastroenterology

## 2010-10-16 ENCOUNTER — Other Ambulatory Visit: Payer: Self-pay

## 2010-10-16 NOTE — Telephone Encounter (Signed)
Pt is complaining of sharp abd pain in the center of the abd.  He has bloating and hard stool.  He is taking Nexium AFTER breakfast and I did advise him to take it 20-30 min BEFORE breakfast.  Pt saw Dr Rodena Medin on 09/19/10 and labs were done.  TSH, CBC, and H pylori normal.  Pt has possible MRSA and was put on antibiotic.  CT scan done 08/12/09.  EGD COLON done 11/06/09 showed hemorrhoids.  Pt is also taking Celexa and Dr Rodena Medin suspects possible effect from SSRI.  Pt has appt on 11/20/10 and wants to be seen sooner but no appt are available.  Does he need to see extender or is there anything else you can recommend?

## 2010-10-16 NOTE — Telephone Encounter (Signed)
appt with extender is a good idea, should have repeat labs a few hours or day prior (cmet)

## 2010-10-16 NOTE — Telephone Encounter (Signed)
Pt has appt scheduled for 10/18/10 with Willette Cluster at [facility] pt aware

## 2010-10-17 ENCOUNTER — Other Ambulatory Visit (INDEPENDENT_AMBULATORY_CARE_PROVIDER_SITE_OTHER): Payer: BC Managed Care – PPO

## 2010-10-17 DIAGNOSIS — R109 Unspecified abdominal pain: Secondary | ICD-10-CM

## 2010-10-17 LAB — COMPREHENSIVE METABOLIC PANEL
AST: 36 U/L (ref 0–37)
Albumin: 4.2 g/dL (ref 3.5–5.2)
Alkaline Phosphatase: 78 U/L (ref 39–117)
BUN: 10 mg/dL (ref 6–23)
Creatinine, Ser: 1.1 mg/dL (ref 0.4–1.5)
Potassium: 4.4 mEq/L (ref 3.5–5.1)

## 2010-10-18 ENCOUNTER — Ambulatory Visit (INDEPENDENT_AMBULATORY_CARE_PROVIDER_SITE_OTHER): Payer: BC Managed Care – PPO | Admitting: Nurse Practitioner

## 2010-10-18 ENCOUNTER — Encounter: Payer: Self-pay | Admitting: Nurse Practitioner

## 2010-10-18 DIAGNOSIS — R141 Gas pain: Secondary | ICD-10-CM

## 2010-10-18 DIAGNOSIS — R7989 Other specified abnormal findings of blood chemistry: Secondary | ICD-10-CM

## 2010-10-18 DIAGNOSIS — K5900 Constipation, unspecified: Secondary | ICD-10-CM

## 2010-10-18 DIAGNOSIS — R140 Abdominal distension (gaseous): Secondary | ICD-10-CM

## 2010-10-18 DIAGNOSIS — F329 Major depressive disorder, single episode, unspecified: Secondary | ICD-10-CM

## 2010-10-18 DIAGNOSIS — F3289 Other specified depressive episodes: Secondary | ICD-10-CM

## 2010-10-18 DIAGNOSIS — R1084 Generalized abdominal pain: Secondary | ICD-10-CM

## 2010-10-18 DIAGNOSIS — G8929 Other chronic pain: Secondary | ICD-10-CM | POA: Insufficient documentation

## 2010-10-18 DIAGNOSIS — R143 Flatulence: Secondary | ICD-10-CM

## 2010-10-18 NOTE — Assessment & Plan Note (Signed)
Mildly elevated ALT of 69 which is new. Total bilirubin mildly elevated at 1.6, it was elevated at 1.6 three  years ago. I do not have a fractionated bilirubin, this could be Gilberts. Will repeat LFTs a few days prior to followup appointment with Dr. Christella Hartigan.

## 2010-10-18 NOTE — Assessment & Plan Note (Signed)
Long history of depression. It should be noted that patient has discontinued his Celexa as he thought the combination of Celexa and proton pump inhibitor may be causing some of his gastrointestinal problems.

## 2010-10-18 NOTE — Progress Notes (Signed)
Terry Tanner [id number] Jul 15, 1965   HISTORY OR PRESENT ILLNESS :  Mr. Shawgo is a 45 year old black male seen one year ago for constipation with rectal bleeding and generalized abdominal pain. He subsequently underwent upper and lower endoscopies which were completely normal except for external hemorrhoids. Prior to his visit with [place], patient had a normal TTG antibody. He also had a CT scan of the abdomen and pelvis with and without contrast for evaluation of hematuria. No acute findings were found. Only findings included small hypodensities in the left kidney and a hypodense structure within the right hepatic lobe too small to characterize. There was a right lower lobe pulmonary nodule measuring 3.6 mm. Sedimentation rate was normal at 5. Symptoms got better after colonoscopy but have slowly returned with time. Patient has a history of depression. He discontinued Celexa for fear that combination of omeprazole and Celexa may be causing abdominal pain. Patient saw PCP office May 9 for evaluation of abdominal pain and a skin abscess.  Labs from that visit revealed a normal TSH, normal CBC, negative H. pylori antibody. Patient called in yesterday to be seen here for ongoing abdominal pain,bloading and constipation. Complains of constant bloating not related to meals, not relieved with defecation, flatus or belching. His stools are hard, and he has generalized abdominal pain. Tried Ex-Lasx without improvement.No normal bowel movements in months. Eats fiber bars.  Comprehensive metabolic profile drawn yesterday pertinent only for a total bilirubin of 1.6,  ALT of 69. Of note, total bilirubin was also 1.6 three years ago. Patient has a history of remote cholecystectomy.  No weight loss.   Current Medications, Allergies, Past Medical History, Past Surgical History, Family History and Social History were reviewed in [employer] record.   PHYSICAL EXAMINATION : General: Well developed   Black male in no acute distress Head: Normocephalic and atraumatic Eyes:  sclerae anicteric,conjunctive pink. Ears: Normal auditory acuity Mouth: No deformity or lesions Neck: Supple, no masses.  Lungs: Clear throughout to auscultation Heart: Regular rate and rhythm; no murmurs heard Abdomen: Soft, nondistended, nontender. No masses or hepatomegaly noted. Normal bowel sounds Rectal: not done Musculoskeletal: Symmetrical with no gross deformities  Skin: No lesions on visible extremities Extremities: No edema or deformities noted Neurological: Alert oriented x 4, grossly nonfocal Cervical Nodes:  No significant cervical adenopathy Psychological:  Alert and cooperative. Normal mood and affect  ASSESSMENT AND PLAN :

## 2010-10-18 NOTE — Assessment & Plan Note (Signed)
Chronic abdominal pain of unclear etiology. In the last year patient has had an unrevealing CT scan of the abdomen and pelvis, unrevealing upper and lower endoscopies. His pain is generalized. Patient is constipated and this could at least contributing to his pain. Patient complains of excessive bloating as well. We'll start by getting his bowel movements moving. Patient will take MiraLax twice a day until he feels bowels are adequately evacuated and then will decrease to once a day. Patient will call us in about a week and if no improvement will start a two-week course of a Align probiotics (I gave him samples). Patient will followup with his primary gastroenterologist Dr. Christella Hartigan in approximately 3 weeks.

## 2010-10-18 NOTE — Patient Instructions (Signed)
Use Miralax twice daily until bowels empty then go to using it daily. Align samples have been given to you to take if you are not better by Monday or Tuesday.  Take 1 capsule daily x 2 weeks.   Schedule an appointment with Dr. Christella Hartigan for 4 weeks.

## 2010-10-18 NOTE — Progress Notes (Signed)
Reviewed and agree with management. Joetta Delprado T. Kalyse Meharg MD FACG 

## 2010-11-20 ENCOUNTER — Ambulatory Visit: Payer: BC Managed Care – PPO | Admitting: Gastroenterology

## 2010-12-07 ENCOUNTER — Ambulatory Visit: Payer: BC Managed Care – PPO | Admitting: Gastroenterology

## 2011-02-22 IMAGING — CT CT ABD-PEL WO/W CM
2 of 10 series · 12 of 46 positions shown, 18 images · IV contrast (agent unspecified)
Comparison: None

CLINICAL DATA: Hematuria

CT ABDOMEN AND PELVIS WITHOUT AND WITH CONTRAST
TECHNIQUE: Multidetector CT imaging of the abdomen and pelvis was
performed without contrast material in one or both body regions,
followed by contrast material(s) and further sections in one or
both body regions.
Contrast: 115 ml of omni 300

[Series 4: renal nephrographic · axial · 0.73mm/px · z∈[-418,-58]mm · 10 of 145 slices shown, 15 images]
[im 13/145  soft-tissue]
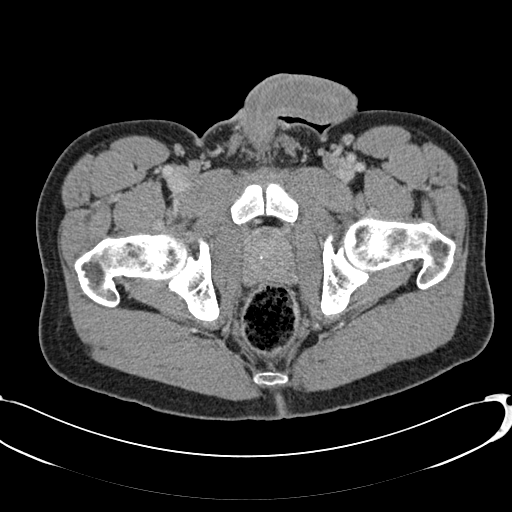
[im 13/145  bone]
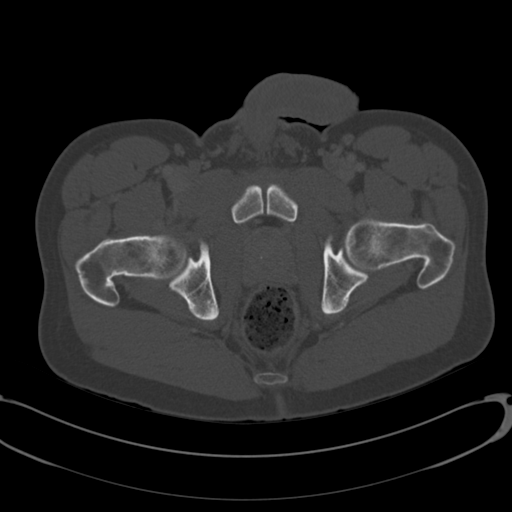
[im 25/145  soft-tissue]
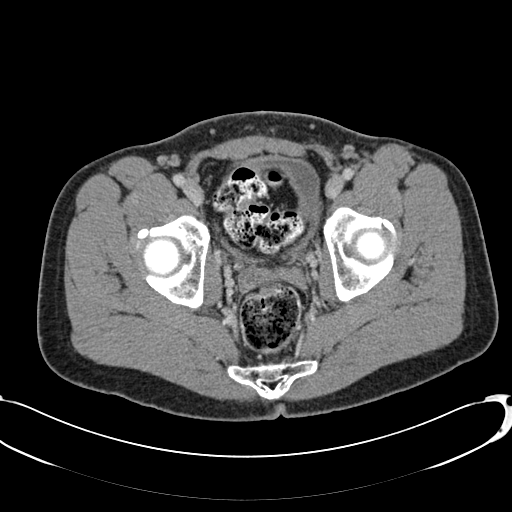
[im 49/145  soft-tissue]
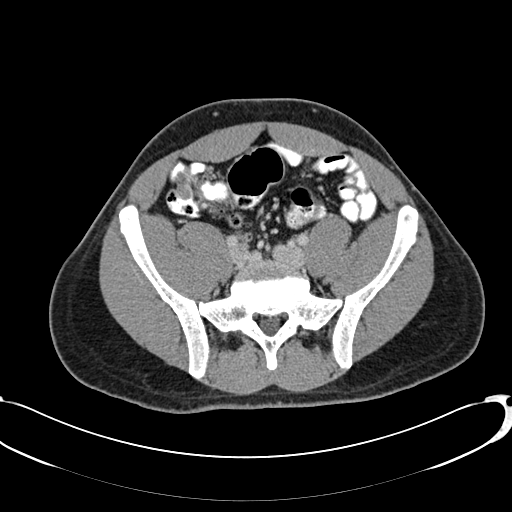
[im 61/145  soft-tissue]
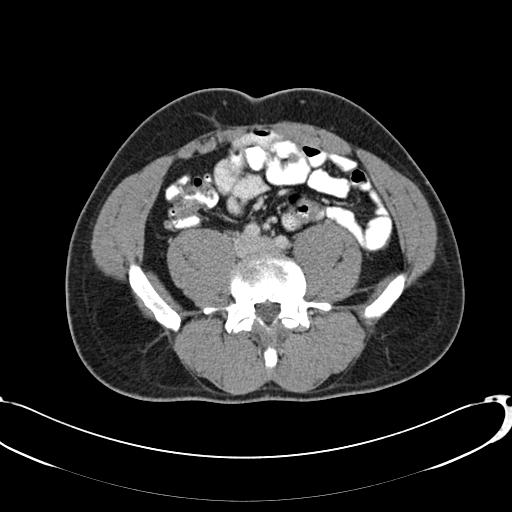
[im 73/145  soft-tissue]
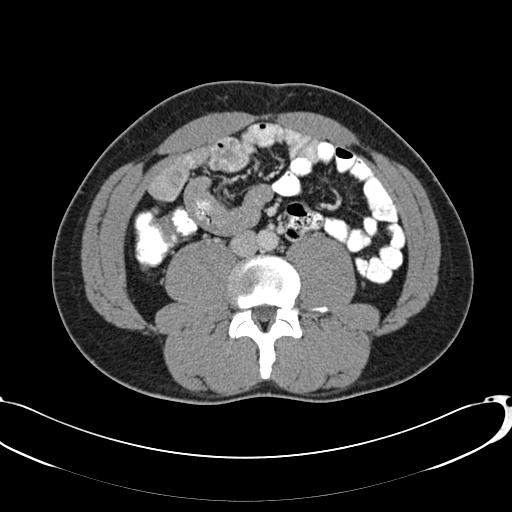
[im 85/145  soft-tissue]
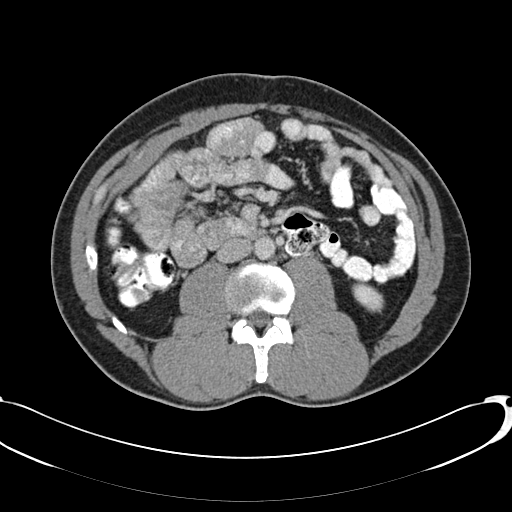
[im 97/145  soft-tissue]
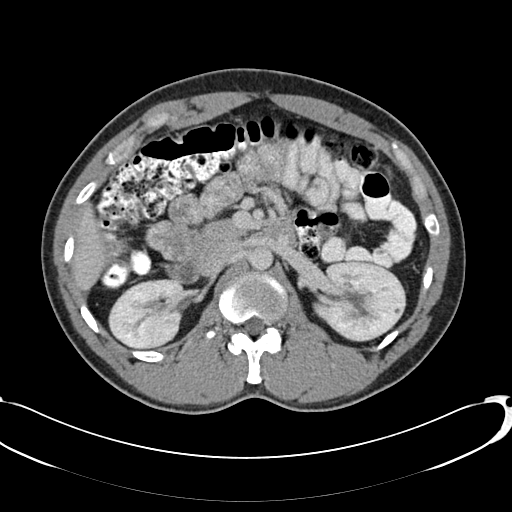
[im 97/145  lung]
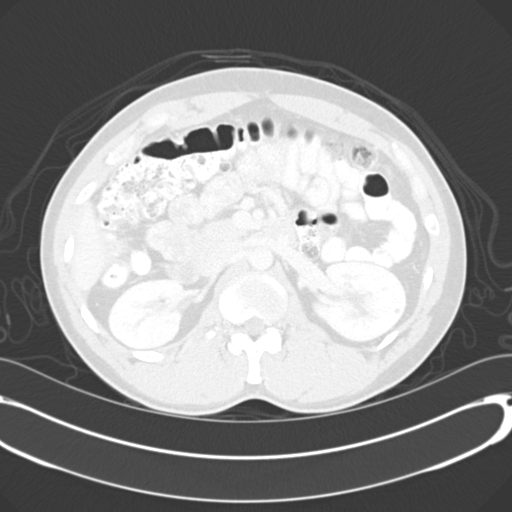
[im 109/145  lung]
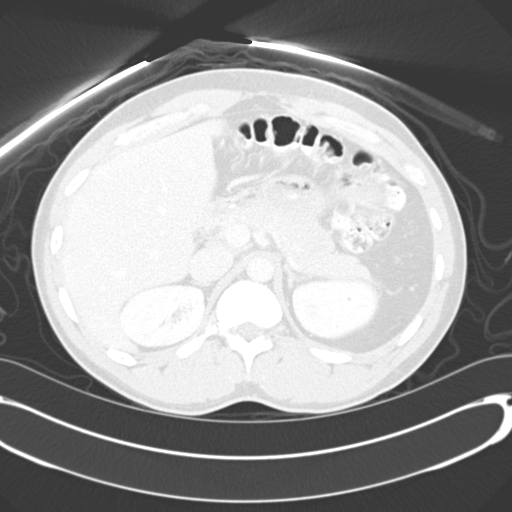
[im 121/145  soft-tissue]
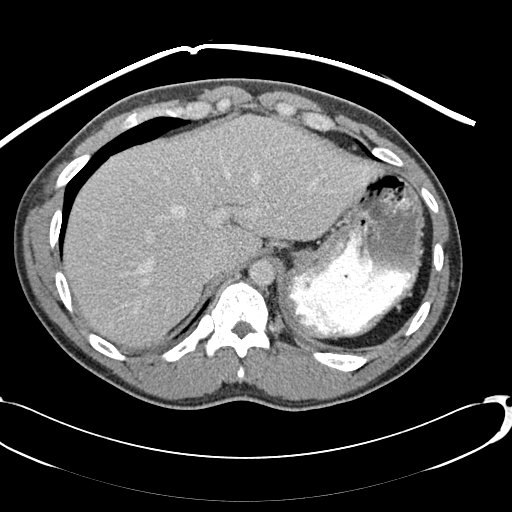
[im 121/145  lung]
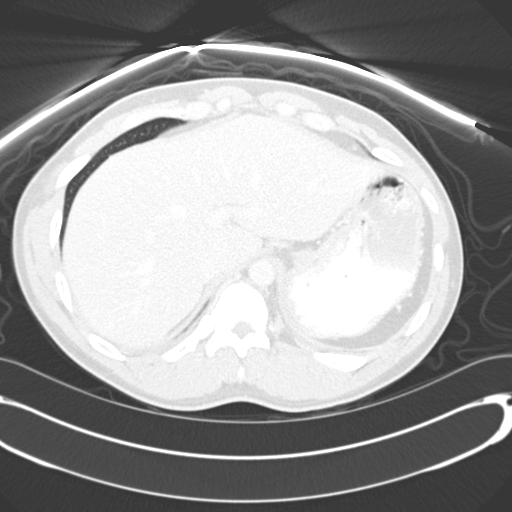
[im 133/145  soft-tissue]
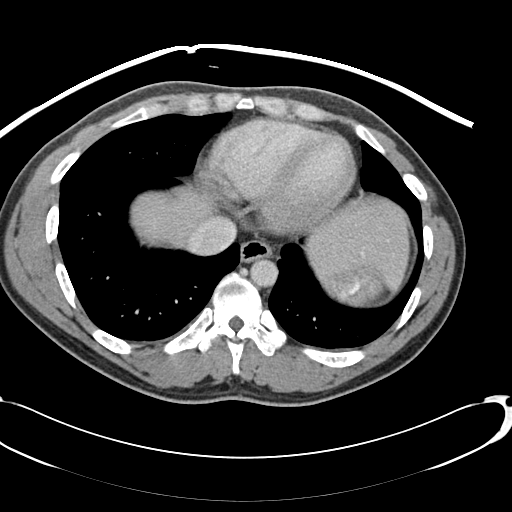
[im 133/145  lung]
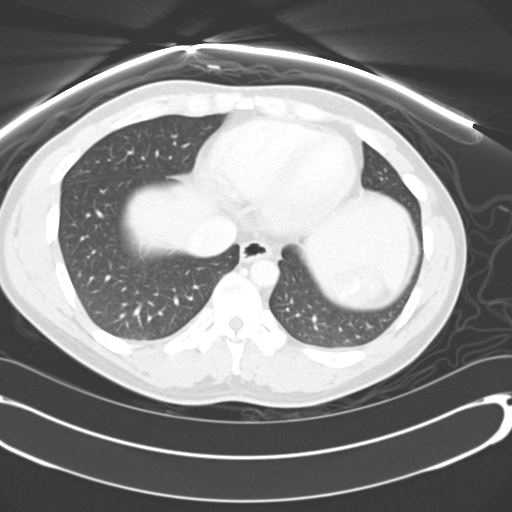
[im 133/145  bone]
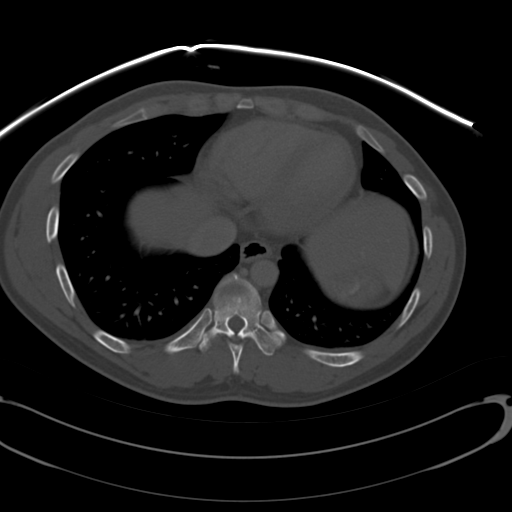

[Series 602: <mpr thick range> · coronal · 0.77mm/px · 2 of 128 slices shown, 3 images]
[im 43/128  soft-tissue]
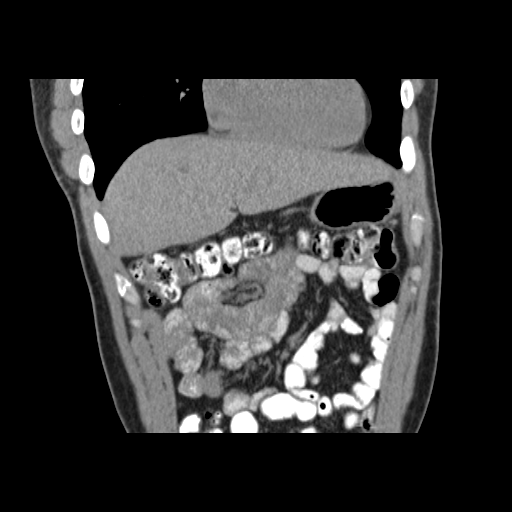
[im 43/128  bone]
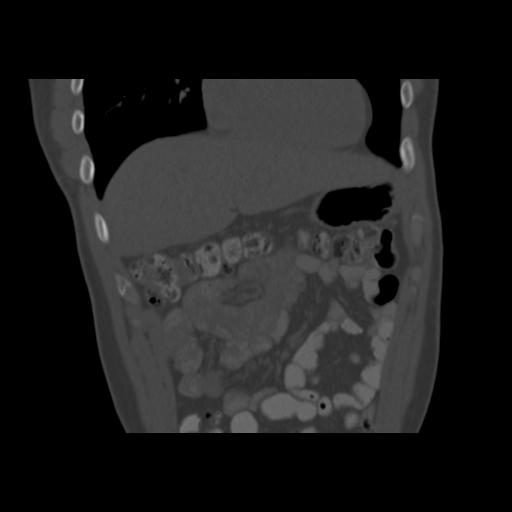
[im 85/128  soft-tissue]
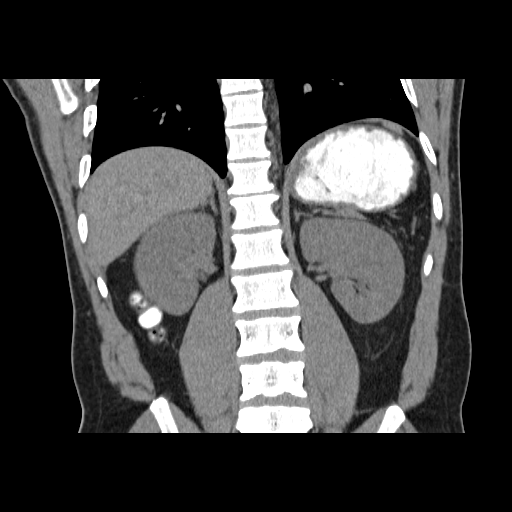

[12 of 46 positions shown; findings below may reference images not displayed]

FINDINGS: Subpleural nodule in the right lower lobe measures
mm, image #8.

Within the right hepatic lobe there is a hypodense structure which
measures 0.9 cm, image 24. The liver is otherwise unremarkable.

Spleen is normal.

Pancreas is normal.

The adrenal glands are normal.

The right kidney appears normal.  There is no evidence for
nephrolithiasis or hydronephrosis.  No mass identified.  The right
ureter appears patent.  No hydroureter or ureterolithiasis.

There is no left-sided nephrolithiasis.  Several small
hypodensities are identified within the left kidney.  These all
measure less than 1 cm and are too small to characterize.
Statistically these likely represent simple cysts.  No left-sided
hydronephrosis, hydroureter or ureterolithiasis.

Status post cholecystectomy.

No biliary ductal dilation.

Stomach and visualized large and small bowel are unremarkable.

Abdominal aorta normal is in caliber.

No significant lymphadenopathy.

No free fluid or abnormal fluid collections.

Appendix identified and normal.

Visualized colon and small bowel are unremarkable.

No free fluid or abnormal fluid collections.

No significant lymphadenopathy.

Urinary bladder is normal.
IMPRESSION: 1.  No findings to explain microscopic hematuria.  No renal stones
identified.
2.  Small hypodensities in the left kidney are too small to
characterize but likely represent simple cysts.
3.  Hypodense structure within the right hepatic lobe is also too
small to characterize.
4.  Right lower lobe pulmonary nodule measuring 3.6 mm. If the
patient is at high risk for bronchogenic carcinoma, follow-up chest
CT at 1 year is recommended.  If the patient is at low risk, no
follow-up is needed.  This recommendation follows the consensus
statement: Guidelines for Management of Small Pulmonary Nodules
Detected on CT Scans:  A Statement from the [HOSPITAL] as
[URL]

## 2011-03-24 ENCOUNTER — Other Ambulatory Visit: Payer: Self-pay | Admitting: Internal Medicine

## 2011-11-29 ENCOUNTER — Encounter: Payer: Self-pay | Admitting: Internal Medicine

## 2011-11-29 ENCOUNTER — Ambulatory Visit (INDEPENDENT_AMBULATORY_CARE_PROVIDER_SITE_OTHER): Payer: BC Managed Care – PPO | Admitting: Internal Medicine

## 2011-11-29 VITALS — BP 130/74 | HR 88 | Temp 98.5°F | Wt 180.0 lb

## 2011-11-29 DIAGNOSIS — F329 Major depressive disorder, single episode, unspecified: Secondary | ICD-10-CM

## 2011-11-29 DIAGNOSIS — F3289 Other specified depressive episodes: Secondary | ICD-10-CM

## 2011-11-29 MED ORDER — SERTRALINE HCL 50 MG PO TABS: 50.0000 mg | ORAL_TABLET | Freq: Every day | ORAL | Status: DC

## 2011-11-29 NOTE — Progress Notes (Signed)
Patient ID: Terry Tanner, male   DOB: [birth year], 46 y.o.   MRN: [id number]  Mood-- self dc'd meds He states that he gets anxious and "every little thing bothers me". He is currently not working due to stress. 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   Past Medical History  Diagnosis Date  . Depression   . Kidney stones     History   Social History  . Marital Status: Married    Spouse Name: N/A    Number of Children: 3  . Years of Education: N/A   Occupational History  . DRIVER [employer]   Social History Main Topics  . Smoking status: Never Smoker   . Smokeless tobacco: Never Used  . Alcohol Use: No  . Drug Use: No  . Sexually Active: Not on file   Other Topics Concern  . Not on file   Social History Narrative  . No narrative on file    Past Surgical History  Procedure Date  . Cholecystectomy     Family History  Problem Relation Age of Onset  . Alcohol abuse Mother   . Cancer Mother     liver  . Diabetes Sister   . Hypertension Father   . Seizures Brother   . Anemia Daughter     No Known Allergies  Current Outpatient Prescriptions on File Prior to Visit  Medication Sig Dispense Refill  . sertraline (ZOLOFT) 50 MG tablet Take 1 tablet (50 mg total) by mouth daily.  90 tablet  3     patient denies chest pain, shortness of breath, orthopnea. Denies lower extremity edema, abdominal pain, change in appetite, change in bowel movements. Patient denies rashes, musculoskeletal complaints. No other specific complaints in a complete review of systems.   BP 130/74  Pulse 88  Temp 98.5 F (36.9 C) (Oral)  Wt 180 lb (81.647 kg)  well-developed well-nourished male in no acute distress. HEENT exam atraumatic, normocephalic, neck supple without jugular venous distention. Chest clear to auscultation cardiac exam S1-S2 are regular. Abdominal exam overweight with bowel sounds, soft and nontender. Extremities no edema. Neurologic  exam is alert with a normal gait.

## 2011-11-29 NOTE — Assessment & Plan Note (Signed)
Generalized anxiety with depression Trial sertraline  I have written a note that he can return to work (he has been out for 1 week)

## 2012-01-02 ENCOUNTER — Telehealth: Payer: Self-pay | Admitting: Internal Medicine

## 2012-01-02 NOTE — Telephone Encounter (Signed)
Caller: [name]/Patient; Patient Name: Terry Tanner; PCP: Birdie Sons; [facility] Call[employer] Phone Number: [phone].  Call regarding needing letter or call into employer, clearing Patient to drive again.  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  Patient needs letter or Office to call to speak with Clinic regarding Zoloft.  Employer thinks it will make Patient too sleepy to drive. Patient denies being sleepy on medication.  Employer/[employer], [phone].  PLEASE FOLLOW UP WITH PATIENT.

## 2012-01-03 NOTE — Telephone Encounter (Signed)
Ok for patient to drive while taking zoloft

## 2012-01-03 NOTE — Telephone Encounter (Signed)
Pt aware.

## 2012-01-06 ENCOUNTER — Telehealth: Payer: Self-pay | Admitting: Internal Medicine

## 2012-01-06 NOTE — Telephone Encounter (Signed)
Caller: [name]/Patient; Patient Name: Terry Tanner; PCP: Birdie Sons (Adults only); [employer] Phone Number: [phone]. 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 Employer: [employer] number: [phone]. Patient needs to have someone speak to his employer's nurse regarding that it is safe for him to return to work. Employer needs to know that the Zoloft medication will not affect him while he is driving.

## 2012-01-07 NOTE — Telephone Encounter (Signed)
Ok to take zoloft

## 2012-01-08 NOTE — Telephone Encounter (Signed)
pts job require the zoloft to be changed celexa, lexapro, prozac, or paxil.  Then pt has to be on for 2 months with no side effects before pt can be released to work again.

## 2012-01-14 MED ORDER — FLUOXETINE HCL 20 MG PO TABS: 20.0000 mg | ORAL_TABLET | Freq: Every day | ORAL | Status: DC

## 2012-01-14 NOTE — Telephone Encounter (Signed)
rx sent in electronically, pt aware 

## 2012-01-14 NOTE — Telephone Encounter (Signed)
Change zoloft to prozac 20 mg po qd.  #90/3 refills

## 2012-05-22 DIAGNOSIS — Z0279 Encounter for issue of other medical certificate: Secondary | ICD-10-CM

## 2012-05-25 ENCOUNTER — Telehealth: Payer: Self-pay | Admitting: Internal Medicine

## 2012-05-25 ENCOUNTER — Encounter: Payer: Self-pay | Admitting: *Deleted

## 2012-05-25 NOTE — Telephone Encounter (Signed)
Letter up front ready for p/u, pt aware 

## 2012-05-25 NOTE — Telephone Encounter (Signed)
Ok to send letter He can drive without restrictions

## 2012-05-25 NOTE — Telephone Encounter (Signed)
Patient called stating that he has just been notified by his employer that he has to have by tomorrow, a letter stating that he can drive without restrictions. Patient has to have this to remain employed. Please advise/assist. Patient states this letter can be faxed to [phone] Attn: E. Ladona Ridgel at [employer].

## 2012-05-29 ENCOUNTER — Encounter: Payer: Self-pay | Admitting: Internal Medicine

## 2012-12-11 ENCOUNTER — Other Ambulatory Visit: Payer: Self-pay | Admitting: Internal Medicine

## 2014-01-02 ENCOUNTER — Other Ambulatory Visit: Payer: Self-pay | Admitting: Internal Medicine

## 2014-01-03 ENCOUNTER — Telehealth: Payer: Self-pay | Admitting: Internal Medicine

## 2014-01-03 NOTE — Telephone Encounter (Signed)
Pt is needing new rx sertraline (ZOLOFT) 50 MG tablet, pt states he is out of town and need rx sent to  [facility] in [place], [address] Pt has appt to get est with dr. Durene Cal in feb.

## 2014-01-04 NOTE — Telephone Encounter (Signed)
He has not been seen since 2013.  He needs a sooner appt in order to get refills.  We can't give refills out to February.  Maybe he can see Susy Frizzle or Dr Durene Cal now for medication refills and keep appt to establish in February

## 2014-01-05 NOTE — Telephone Encounter (Signed)
Left msg for pt to call and schedule appt with [facility] or [place]

## 2014-01-10 NOTE — Telephone Encounter (Signed)
appt scheduled for pt.  

## 2014-02-12 ENCOUNTER — Encounter (HOSPITAL_COMMUNITY): Payer: Self-pay | Admitting: Emergency Medicine

## 2014-02-12 ENCOUNTER — Emergency Department (INDEPENDENT_AMBULATORY_CARE_PROVIDER_SITE_OTHER)
Admission: EM | Admit: 2014-02-12 | Discharge: 2014-02-12 | Disposition: A | Discharge status: Home / Self Care | Payer: BC Managed Care – PPO | Source: Home / Self Care | Attending: Emergency Medicine | Admitting: Emergency Medicine

## 2014-02-12 DIAGNOSIS — H6092 Unspecified otitis externa, left ear: Secondary | ICD-10-CM

## 2014-02-12 DIAGNOSIS — H6122 Impacted cerumen, left ear: Secondary | ICD-10-CM

## 2014-02-12 DIAGNOSIS — H66002 Acute suppurative otitis media without spontaneous rupture of ear drum, left ear: Secondary | ICD-10-CM

## 2014-02-12 MED ORDER — AMOXICILLIN 500 MG PO CAPS: 1000.0000 mg | ORAL_CAPSULE | Freq: Three times a day (TID) | ORAL | Status: DC

## 2014-02-12 MED ORDER — NEOMYCIN-POLYMYXIN-HC 3.5-10000-1 OT SUSP: 4.0000 [drp] | Freq: Three times a day (TID) | OTIC | Status: DC

## 2014-02-12 NOTE — Discharge Instructions (Signed)
Cerumen Impaction °A cerumen impaction is when the wax in your ear forms a plug. This plug usually causes reduced hearing. Sometimes it also causes an earache or dizziness. Removing a cerumen impaction can be difficult and painful. The wax sticks to the ear canal. The canal is sensitive and bleeds easily. If you try to remove a heavy wax buildup with a cotton tipped swab, you may push it in further. °Irrigation with water, suction, and small ear curettes may be used to clear out the wax. If the impaction is fixed to the skin in the ear canal, ear drops may be needed for a few days to loosen the wax. People who build up a lot of wax frequently can use ear wax removal products available in your local drugstore. °SEEK MEDICAL CARE IF:  °You develop an earache, increased hearing loss, or marked dizziness. °Document Released: 06/06/2004 Document Revised: 07/22/2011 Document Reviewed: 07/27/2009 °ExitCare® Patient Information ©2015 ExitCare, LLC. This information is not intended to replace advice given to you by your health care provider. Make sure you discuss any questions you have with your health care provider. ° °Otitis Externa °Otitis externa is a bacterial or fungal infection of the outer ear canal. This is the area from the eardrum to the outside of the ear. Otitis externa is sometimes called "swimmer's ear." °CAUSES  °Possible causes of infection include: °· Swimming in dirty water. °· Moisture remaining in the ear after swimming or bathing. °· Mild injury (trauma) to the ear. °· Objects stuck in the ear (foreign body). °· Cuts or scrapes (abrasions) on the outside of the ear. °SIGNS AND SYMPTOMS  °The first symptom of infection is often itching in the ear canal. Later signs and symptoms may include swelling and redness of the ear canal, ear pain, and yellowish-white fluid (pus) coming from the ear. The ear pain may be worse when pulling on the earlobe. °DIAGNOSIS  °Your health care provider will perform a  physical exam. A sample of fluid may be taken from the ear and examined for bacteria or fungi. °TREATMENT  °Antibiotic ear drops are often given for 10 to 14 days. Treatment may also include pain medicine or corticosteroids to reduce itching and swelling. °HOME CARE INSTRUCTIONS  °· Apply antibiotic ear drops to the ear canal as prescribed by your health care provider. °· Take medicines only as directed by your health care provider. °· If you have diabetes, follow any additional treatment instructions from your health care provider. °· Keep all follow-up visits as directed by your health care provider. °PREVENTION  °· Keep your ear dry. Use the corner of a towel to absorb water out of the ear canal after swimming or bathing. °· Avoid scratching or putting objects inside your ear. This can damage the ear canal or remove the protective wax that lines the canal. This makes it easier for bacteria and fungi to grow. °· Avoid swimming in lakes, polluted water, or poorly chlorinated pools. °· You may use ear drops made of rubbing alcohol and vinegar after swimming. Combine equal parts of white vinegar and alcohol in a bottle. Put 3 or 4 drops into each ear after swimming. °SEEK MEDICAL CARE IF:  °· You have a fever. °· Your ear is still red, swollen, painful, or draining pus after 3 days. °· Your redness, swelling, or pain gets worse. °· You have a severe headache. °· You have redness, swelling, pain, or tenderness in the area behind your ear. °MAKE SURE YOU:  °·   Understand these instructions.  Will watch your condition.  Will get help right away if you are not doing well or get worse. Document Released: 04/29/2005 Document Revised: 09/13/2013 Document Reviewed: 05/16/2011 [facility] Patient Information 2015 [place]ExitCare, MarylandLLC. This information is not intended to replace advice given to you by your health care provider. Make sure you discuss any questions you have with your health care provider.  Otitis Media Otitis  media is redness, soreness, and inflammation of the middle ear. Otitis media may be caused by allergies or, most commonly, by infection. Often it occurs as a complication of the common cold. SIGNS AND SYMPTOMS Symptoms of otitis media may include:  Earache.  Fever.  Ringing in your ear.  Headache.  Leakage of fluid from the ear. DIAGNOSIS To diagnose otitis media, your health care provider will examine your ear with an otoscope. This is an instrument that allows your health care provider to see into your ear in order to examine your eardrum. Your health care provider also will ask you questions about your symptoms. TREATMENT  Typically, otitis media resolves on its own within 3-5 days. Your health care provider may prescribe medicine to ease your symptoms of pain. If otitis media does not resolve within 5 days or is recurrent, your health care provider may prescribe antibiotic medicines if he or she suspects that a bacterial infection is the cause. HOME CARE INSTRUCTIONS   If you were prescribed an antibiotic medicine, finish it all even if you start to feel better.  Take medicines only as directed by your health care provider.  Keep all follow-up visits as directed by your health care provider. SEEK MEDICAL CARE IF:  You have otitis media only in one ear, or bleeding from your nose, or both.  You notice a lump on your neck.  You are not getting better in 3-5 days.  You feel worse instead of better. SEEK IMMEDIATE MEDICAL CARE IF:   You have pain that is not controlled with medicine.  You have swelling, redness, or pain around your ear or stiffness in your neck.  You notice that part of your face is paralyzed.  You notice that the bone behind your ear (mastoid) is tender when you touch it. MAKE SURE YOU:   Understand these instructions.  Will watch your condition.  Will get help right away if you are not doing well or get worse. Document Released: 02/02/2004 Document  Revised: 09/13/2013 Document Reviewed: 11/24/2012 [facility]Care Patient Information 2015 [place]ExitCare, MarylandLLC. This information is not intended to replace advice given to you by your health care provider. Make sure you discuss any questions you have with your health care provider.

## 2014-02-12 NOTE — ED Notes (Signed)
Left ear ache.  Patient has cough, cold, runny nose, and sniffling for one week

## 2014-02-12 NOTE — ED Provider Notes (Signed)
  Chief Complaint   Otalgia   History of Present Illness   Terry Tanner is a 48 year old male who has had a one-week history of left ear pain, congestion, nasal congestion, rhinorrhea, cough, and headache. He tried to clean his ear out with a Q-tip and this just made it worse. There's been no drainage or bleeding from the ear.  Review of Systems   Other than as noted above, the patient denies any of the following symptoms: Systemic:  No fevers or chills. Eye:  No redness, pain, discharge, itching, blurred vision, or diplopia. ENT:  No headache, nasal congestion, sneezing, itching, epistaxis, ear pain, decreased hearing, ringing in ears, vertigo, or tinnitus.  No oral lesions, sore throat, or hoarseness. Neck:  No neck pain or adenopathy. Skin:  No rash or itching.  PMFSH   Past medical history, family history, social history, meds, and allergies were reviewed.   Physical Examination     Vital signs:  BP 122/78  Pulse 88  Temp(Src) 98.3 F (36.8 C) (Oral)  Resp 18  SpO2 97% General:  Alert and oriented.  In no distress.  Skin warm and dry. Eye:  PERRL, full EOMs, lids and conjunctiva normal.   ENT:  There was a cerumen impaction ear canal. This was irrigated clear. The canal was inflamed and irritated and. The TM was dull car with multiple small vesicles, and erythematous. The right TM and canal are normal.  Nasal mucosa not congested and without drainage.  Mucous membranes moist, no oral lesions, normal dentition, pharynx clear.  No cranial or facial pain to palplation. There is no pain or swelling over the mastoid. Neck:  Supple, full ROM.  No adenopathy, tenderness or mass.  Thyroid normal. Lungs:  Breath sounds clear and equal bilaterally.  No wheezes, rales or rhonchi. Heart:  Rhythm regular, without extrasystoles.  No gallops or murmers. Skin:  Clear, warm and dry.   Assessment   The primary encounter diagnosis was Acute suppurative otitis media of left ear without  spontaneous rupture of tympanic membrane, recurrence not specified. Diagnoses of Otitis externa, left and Cerumen impaction, left were also pertinent to this visit.  Plan    1.  Meds:  The following meds were prescribed:   Discharge Medication List as of 02/12/2014 12:00 PM    START taking these medications   Details  amoxicillin (AMOXIL) 500 MG capsule Take 2 capsules (1,000 mg total) by mouth 3 (three) times daily., Starting 02/12/2014, Until Discontinued, Normal    neomycin-polymyxin-hydrocortisone (CORTISPORIN) 3.5-10000-1 otic suspension Place 4 drops into the left ear 3 (three) times daily., Starting 02/12/2014, Until Discontinued, Normal        2.  Patient Education/Counseling:  The patient was given appropriate handouts, self care instructions, and instructed in symptomatic relief.  Advised to keep water out of the ear.  3.  Follow up:  The patient was told to follow up here if no better in 3 to 4 days, or sooner if becoming worse in any way, and given some red flag symptoms such as worsening ear pain which would prompt immediate return.       Reuben Likesavid C Maicee Ullman, MD 02/12/14 [phone]

## 2014-03-02 ENCOUNTER — Encounter: Payer: Self-pay | Admitting: Family Medicine

## 2014-03-02 ENCOUNTER — Ambulatory Visit (INDEPENDENT_AMBULATORY_CARE_PROVIDER_SITE_OTHER): Payer: BC Managed Care – PPO | Admitting: Family Medicine

## 2014-03-02 VITALS — BP 120/90 | Temp 97.5°F | Wt 169.0 lb

## 2014-03-02 DIAGNOSIS — F528 Other sexual dysfunction not due to a substance or known physiological condition: Secondary | ICD-10-CM

## 2014-03-02 DIAGNOSIS — F32A Depression, unspecified: Secondary | ICD-10-CM

## 2014-03-02 DIAGNOSIS — F329 Major depressive disorder, single episode, unspecified: Secondary | ICD-10-CM

## 2014-03-02 MED ORDER — SERTRALINE HCL 100 MG PO TABS: 100.0000 mg | ORAL_TABLET | Freq: Every day | ORAL | Status: DC

## 2014-03-02 NOTE — Patient Instructions (Signed)
Thanks for coming in. I agree your depression is not as well controlled as we would like. Increase zoloft to 100mg  and check in with me in 1 month. If any thoughts of hurting yourself or others, come see me immediately or seek care.

## 2014-03-02 NOTE — Assessment & Plan Note (Signed)
Poorly controlled. Increase Zoloft to 100 mg from 50 mg. Followup in 4 weeks with repeat Phq 9 which was scored at 12 today

## 2014-03-02 NOTE — Progress Notes (Signed)
  Tana ConchStephen Jamani Eley, MD Phone: [phone]  Subjective:   Leanor Rubensteinsaac L Oehlert is a 48 y.o. year old very pleasant male patient who presents with the following:  Depression - poor control Also previously listed as generalized anxiety with depression Diagnosed 3-4 years ago. Has been on zoloft for 2 years, previously on prozac. PHQ9 of 12. Does complain of some irritability ROS- No SI HI.   Past Medical History- Patient Active Problem List   Diagnosis Date Noted  . Depression 10/27/2009    Priority: Medium  . Constipation 10/18/2010  . Elevated LFTs 10/18/2010  . Anemia 09/19/2010  . HEMATURIA 08/29/2009  . ERECTILE DYSFUNCTION 01/16/2007   Medications- reviewed and updated Current Outpatient Prescriptions  Medication Sig Dispense Refill  . sertraline (ZOLOFT) 100 MG tablet Take 1 tablet (100 mg total) by mouth daily.  90 tablet  1   No current facility-administered medications for this visit.    Objective: BP 120/90  Temp(Src) 97.5 F (36.4 C)  Wt 169 lb (76.658 kg) Gen: NAD, resting comfortably in chair Psych: not anxious appearing, no obvious depressed mood  Assessment/Plan:  Depression Poorly controlled. Increase Zoloft to 100 mg from 50 mg. Followup in 4 weeks with repeat Phq 9 which was scored at 12 today  Return precautions advised.   Meds ordered this encounter  Medications  . sertraline (ZOLOFT) 100 MG tablet    Sig: Take 1 tablet (100 mg total) by mouth daily.    Dispense:  90 tablet    Refill:  1

## 2014-07-04 ENCOUNTER — Ambulatory Visit (INDEPENDENT_AMBULATORY_CARE_PROVIDER_SITE_OTHER): Payer: PRIVATE HEALTH INSURANCE | Admitting: Family Medicine

## 2014-07-04 ENCOUNTER — Encounter: Payer: Self-pay | Admitting: Family Medicine

## 2014-07-04 VITALS — BP 128/64 | Temp 98.2°F | Wt 174.0 lb

## 2014-07-04 DIAGNOSIS — Z0000 Encounter for general adult medical examination without abnormal findings: Secondary | ICD-10-CM

## 2014-07-04 DIAGNOSIS — IMO0001 Reserved for inherently not codable concepts without codable children: Secondary | ICD-10-CM

## 2014-07-04 MED ORDER — BUPROPION HCL ER (XL) 150 MG PO TB24: 150.0000 mg | ORAL_TABLET | Freq: Every day | ORAL | Status: DC

## 2014-07-04 NOTE — Assessment & Plan Note (Signed)
PHQ9 of 11. Change to wellbutrin 150mg  Xl and follow up 2 weeks to consider titration to 300mg . Wants something with less sexual side effects.

## 2014-07-04 NOTE — Patient Instructions (Addendum)
Come back for fasting labs. Water only after midnight. Schedule lab visit within next week or so.   Stop Zoloft. Start wellbutrin. Must take this daily. See me in 2 weeks to check in or immediately seek care if you developed thoughts of hurting yourself or someone else.   Recommend you increase your fruits/veggies in your diet and start exercising.   Also advise you to see your dentist.

## 2014-07-04 NOTE — Progress Notes (Signed)
Terry ConchStephen Deshanna Kama, MD Phone: [phone]  Subjective:  Patient presents today to establish care with me as their new primary care provider and for CPE. Patient was formerly a patient of Dr. Cato MulliganSwords. Chief complaint-noted.   Patient overall has been doing well except for depression listed below. He will come back for fasting labs. He is not exercising and admits to some poor food choices. He has dealt with constipation for a # of years and admits may be better with increased fiber. He does have Nocturia 1-2x night which has slowly increased over time. He wants to proceed forward with prostate cancer screening.   Depression- poor control due to noncompliance.  Poor control last visit with phq9 of 12, we increased zoloft from 50 to 100mg  at that time. He is bothered by sexual dysfunction, feeling too mellow and upset stomach on the 100mg  and has only been taking every other day and did not take yet today.   He would like to try alternative med. PHQ9 of 11 today.   ROS- No SI/HI. No chest pain, nausea, vomiting.   The following were reviewed and entered/updated in epic: Past Medical History  Diagnosis Date  . Depression   . Kidney stones     ? cause hematuria reported in problem list  . Anemia 09/19/2010    History of anemia reported. 2012 and 2013 non anemia.    . Hematuria 08/29/2009    2011- per problem list. Patient denies history.      Patient Active Problem List   Diagnosis Date Noted  . Depression 10/27/2009    Priority: Medium  . Constipation 10/18/2010    Priority: Low  . Elevated LFTs 10/18/2010    Priority: Low  . ERECTILE DYSFUNCTION 01/16/2007    Priority: Low   Past Surgical History  Procedure Laterality Date  . Cholecystectomy      Family History  Problem Relation Age of Onset  . Alcohol abuse Mother   . Cancer Mother     liver  . Diabetes Sister   . Hypertension Father   . Seizures Brother   . Anemia Daughter     Medications- reviewed and updated Current  Outpatient Prescriptions  Medication Sig Dispense Refill  . buPROPion (WELLBUTRIN XL) 150 MG 24 hr tablet Take 1 tablet (150 mg total) by mouth daily. 30 tablet 2   No current facility-administered medications for this visit.    Allergies-reviewed and updated No Known Allergies  History   Social History  . Marital Status: Married    Spouse Name: N/A  . Number of Children: 3  . Years of Education: N/A   Occupational History  . DRIVER [employer]   Social History Main Topics  . Smoking status: Never Smoker   . Smokeless tobacco: Never Used  . Alcohol Use: No  . Drug Use: No  . Sexual Activity: Not on file   Other Topics Concern  . None   Social History Narrative   Married. 2 children. No grandkids.       Truck Hospital doctorDriver for [employer]      Hobbies: [employer], English as a second language teacherbowling, movies    ROS--See HPI   Objective: BP 128/64 mmHg  Temp(Src) 98.2 F (36.8 C)  Wt 174 lb (78.926 kg) Gen: NAD, resting comfortably No scleral icterus HEENT: Mucous membranes are moist. Oropharynx normal Neck: no thyromegaly CV: RRR no murmurs rubs or gallops Lungs: CTAB no crackles, wheeze, rhonchi Abdomen: soft/nontender/nondistended/normal bowel sounds. No rebound or guarding.  Ext: no edema, 2+ PT  pulses Rectal: mildly enlarged, no nodules on prostate Skin: warm, dry, no rash Neuro: grossly normal, moves all extremities, PERRLA   Assessment/Plan:  49 y.o. male presenting for annual physical.  Health Maintenance counseling: 1. Anticipatory guidance: Patient counseled regarding regular dental exams (hasnt seen in year), wearing seatbelts (truckdriver) 2. Risk factor reduction:  Advised patient of need for regular exercise and diet rich and fruits and vegetables to reduce risk of heart attack and stroke.  3. Immunizations/screenings/ancillary studies- none needed  Depression PHQ9 of 11. Change to wellbutrin  Xl and follow up 2 weeks to consider titration to . Wants something  with less sexual side effects.     History elevated LFTs-repeat with labs today  Return precautions advised. 2 week follow up  Future fasting Orders Placed This Encounter  Procedures  . CBC    [place]    Standing Status: Future     Number of Occurrences:      Standing Expiration Date: 07/05/2015  . Comprehensive metabolic panel    [place]    Standing Status: Future     Number of Occurrences:      Standing Expiration Date: 07/05/2015    Order Specific Question:  Has the patient fasted?    Answer:  No  . Lipid panel    [place]    Standing Status: Future     Number of Occurrences:      Standing Expiration Date: 07/05/2015    Order Specific Question:  Has the patient fasted?    Answer:  No  . TSH    [place]    Standing Status: Future     Number of Occurrences:      Standing Expiration Date: 07/05/2015  . PSA    Standing Status: Future     Number of Occurrences:      Standing Expiration Date: 07/05/2015  . POCT urinalysis dipstick    Standing Status: Future     Number of Occurrences:      Standing Expiration Date: 07/05/2015    Meds ordered this encounter  Medications  . buPROPion (WELLBUTRIN XL) 150 MG 24 hr tablet    Sig: Take 1 tablet (150 mg total) by mouth daily.    Dispense:  30 tablet    Refill:  2

## 2014-07-11 ENCOUNTER — Other Ambulatory Visit: Payer: Self-pay

## 2014-07-18 ENCOUNTER — Other Ambulatory Visit (INDEPENDENT_AMBULATORY_CARE_PROVIDER_SITE_OTHER): Payer: PRIVATE HEALTH INSURANCE

## 2014-07-18 ENCOUNTER — Encounter: Payer: Self-pay | Admitting: Family Medicine

## 2014-07-18 DIAGNOSIS — Z0000 Encounter for general adult medical examination without abnormal findings: Secondary | ICD-10-CM

## 2014-07-18 DIAGNOSIS — R319 Hematuria, unspecified: Secondary | ICD-10-CM | POA: Insufficient documentation

## 2014-07-18 DIAGNOSIS — IMO0001 Reserved for inherently not codable concepts without codable children: Secondary | ICD-10-CM

## 2014-07-18 LAB — COMPREHENSIVE METABOLIC PANEL
ALBUMIN: 4.4 g/dL (ref 3.5–5.2)
ALT: 22 U/L (ref 0–53)
AST: 15 U/L (ref 0–37)
Alkaline Phosphatase: 77 U/L (ref 39–117)
BUN: 15 mg/dL (ref 6–23)
CALCIUM: 9.6 mg/dL (ref 8.4–10.5)
CO2: 29 mEq/L (ref 19–32)
CREATININE: 1.1 mg/dL (ref 0.40–1.50)
Chloride: 104 mEq/L (ref 96–112)
GFR: 92.47 mL/min (ref >60.00)
Glucose, Bld: 77 mg/dL (ref 70–99)
Potassium: 4.4 mEq/L (ref 3.5–5.1)
Sodium: 138 mEq/L (ref 135–145)
Total Bilirubin: 0.9 mg/dL (ref 0.2–1.2)
Total Protein: 7 g/dL (ref 6.0–8.3)

## 2014-07-18 LAB — LIPID PANEL
Cholesterol: 176 mg/dL (ref 0–200)
HDL: 43.2 mg/dL (ref >39.00)
LDL CALC: 118 mg/dL — AB (ref 0–99)
NonHDL: 132.8
Total CHOL/HDL Ratio: 4
Triglycerides: 75 mg/dL (ref 0.0–149.0)
VLDL: 15 mg/dL (ref 0.0–40.0)

## 2014-07-18 LAB — CBC
HCT: 39.9 % (ref 39.0–52.0)
HEMOGLOBIN: 14.1 g/dL (ref 13.0–17.0)
MCHC: 35.4 g/dL (ref 30.0–36.0)
MCV: 89.4 fl (ref 78.0–100.0)
Platelets: 434 10*3/uL — ABNORMAL HIGH (ref 150.0–400.0)
RBC: 4.47 Mil/uL (ref 4.22–5.81)
RDW: 13.9 % (ref 11.5–15.5)
WBC: 5.4 10*3/uL (ref 4.0–10.5)

## 2014-07-18 LAB — POCT URINALYSIS DIPSTICK
BILIRUBIN UA: NEGATIVE
Glucose, UA: NEGATIVE
KETONES UA: NEGATIVE
Leukocytes, UA: NEGATIVE
Nitrite, UA: NEGATIVE
Protein, UA: NEGATIVE
Spec Grav, UA: 1.02
UROBILINOGEN UA: 0.2
pH, UA: 5.5

## 2014-07-18 LAB — PSA: PSA: 1.05 ng/mL (ref 0.10–4.00)

## 2014-07-18 LAB — TSH: TSH: 1.65 u[IU]/mL (ref 0.35–4.50)

## 2014-08-01 ENCOUNTER — Ambulatory Visit: Payer: PRIVATE HEALTH INSURANCE | Admitting: Family Medicine

## 2014-10-11 ENCOUNTER — Other Ambulatory Visit: Payer: Self-pay | Admitting: Family Medicine

## 2014-12-21 ENCOUNTER — Telehealth: Payer: Self-pay | Admitting: Family Medicine

## 2014-12-21 NOTE — Telephone Encounter (Signed)
Yes thanks 

## 2014-12-21 NOTE — Telephone Encounter (Signed)
See below

## 2014-12-21 NOTE — Telephone Encounter (Signed)
Patient called in today to see if MD would up his RX Wellbutrin.  Is it okay to use a SDA?

## 2014-12-26 ENCOUNTER — Ambulatory Visit (INDEPENDENT_AMBULATORY_CARE_PROVIDER_SITE_OTHER): Payer: PRIVATE HEALTH INSURANCE | Admitting: Family Medicine

## 2014-12-26 ENCOUNTER — Encounter: Payer: Self-pay | Admitting: Family Medicine

## 2014-12-26 VITALS — BP 110/80 | HR 72 | Temp 98.5°F | Wt 170.0 lb

## 2014-12-26 DIAGNOSIS — R45851 Suicidal ideations: Secondary | ICD-10-CM | POA: Diagnosis not present

## 2014-12-26 DIAGNOSIS — F329 Major depressive disorder, single episode, unspecified: Secondary | ICD-10-CM

## 2014-12-26 DIAGNOSIS — F32A Depression, unspecified: Secondary | ICD-10-CM

## 2014-12-26 MED ORDER — BUPROPION HCL ER (XL) 300 MG PO TB24: 300.0000 mg | ORAL_TABLET | Freq: Every day | ORAL | Status: DC

## 2014-12-26 NOTE — Progress Notes (Signed)
Pre visit review using our clinic review tool, if applicable. No additional management support is needed unless otherwise documented below in the visit note. 
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
## 2014-12-26 NOTE — Patient Instructions (Signed)
Medication Instructions:  Pick up higher dose wellbutrin ( ) from your pharmacy and with next dose- take higher dose.   Other Instructions:  For Wellbutrin Taking the medicine as directed and not missing any doses is one of the best things you can do to treat your depression.  Here are some things to keep in mind:  1) Side effects (stomach upset, some increased anxiety) may happen before you notice a benefit.  These side effects typically go away over time. 2) Changes to your dose of medicine or a change in medication all together is sometimes necessary 3) Most people need to be on medication at least 6-12 months 4) Many people will notice an improvement within two weeks but the full effect of the medication can take up to 4-6 weeks 5) Stopping the medication when you start feeling better often results in a return of symptoms 6) If you start having thoughts of hurting yourself or others after starting this medicine, please call me at [phone] immediately or call 911.   Follow-Up (all visit scheduling, rescheduling, cancellations including labs should be scheduled at front desk): Call Keba on Monday and let us know if you are having any thoughts of hurting yourself

## 2014-12-27 NOTE — Assessment & Plan Note (Signed)
Poor control primarily based on suicidal ideation. Trigger of strained relationship. Discussed titration of wellbutrin to 300 mg- patient agrees. Advised follow up early next week and patient declines. He can potentially lose his job if does not give 2 week notice so he will see me 2 weeks out. He also agrees to see therapist. He will call Keba on Monday to update her on if any SI. He did have SI over weekend and considered a plan but did not focus on this plan or move forward with gathering supplies. He is able to contract for safety in regarsd to [facility]

## 2015-04-26 ENCOUNTER — Other Ambulatory Visit: Payer: Self-pay | Admitting: Family Medicine

## 2015-07-17 ENCOUNTER — Ambulatory Visit: Payer: PRIVATE HEALTH INSURANCE | Admitting: Family Medicine

## 2015-07-31 ENCOUNTER — Ambulatory Visit: Payer: PRIVATE HEALTH INSURANCE | Admitting: Family Medicine

## 2016-02-01 ENCOUNTER — Encounter: Payer: Self-pay | Admitting: Family Medicine

## 2016-02-01 ENCOUNTER — Ambulatory Visit (INDEPENDENT_AMBULATORY_CARE_PROVIDER_SITE_OTHER): Payer: 59 | Admitting: Family Medicine

## 2016-02-01 VITALS — BP 120/80 | HR 91 | Temp 98.2°F | Ht 67.0 in | Wt 159.5 lb

## 2016-02-01 DIAGNOSIS — F329 Major depressive disorder, single episode, unspecified: Secondary | ICD-10-CM | POA: Diagnosis not present

## 2016-02-01 DIAGNOSIS — F32A Depression, unspecified: Secondary | ICD-10-CM

## 2016-02-01 DIAGNOSIS — L02419 Cutaneous abscess of limb, unspecified: Secondary | ICD-10-CM | POA: Diagnosis not present

## 2016-02-01 DIAGNOSIS — R319 Hematuria, unspecified: Secondary | ICD-10-CM

## 2016-02-01 MED ORDER — BUPROPION HCL ER (XL) 300 MG PO TB24: 300.0000 mg | ORAL_TABLET | Freq: Every day | ORAL | 5 refills | Status: DC

## 2016-02-01 MED ORDER — DOXYCYCLINE HYCLATE 100 MG PO TABS: 100.0000 mg | ORAL_TABLET | Freq: Two times a day (BID) | ORAL | 0 refills | Status: DC

## 2016-02-01 NOTE — Patient Instructions (Signed)
Drop off urine before you leave- refer to urology if there is actually blood  Refilled depression meds  Take antibiotic for 7 days. See us back if fever or worsening symptoms or recurrence

## 2016-02-01 NOTE — Assessment & Plan Note (Addendum)
S: patient with phq2 of 0 today. He states feels somewhat flat on wellbutrin and doesn't tend to get upset. Given anxiety and prior tendency to get upset we agreed best to likely remain on medicine A/P: refilled med for 6 months- consider titrating back to 150mg  at follow up If continues to do so well

## 2016-02-01 NOTE — Progress Notes (Signed)
Subjective:  Leanor Rubensteinsaac L Scholler is a 50 y.o. year old very pleasant male patient who presents for/with See problem oriented charting ROS- No Si/HI- does feel flat at times. No fever, chills, nausea, vomiting .see any ROS included in HPI as well.   Past Medical History-  Patient Active Problem List   Diagnosis Date Noted  . Hematuria 07/18/2014    Priority: High  . Depression 10/27/2009    Priority: Medium  . Constipation 10/18/2010    Priority: Low  . Elevated LFTs 10/18/2010    Priority: Low  . ERECTILE DYSFUNCTION 01/16/2007    Priority: Low    Medications- reviewed and updated Current Outpatient Prescriptions  Medication Sig Dispense Refill  . buPROPion (WELLBUTRIN XL) 300 MG 24 hr tablet Take 1 tablet (300 mg total) by mouth daily. 30 tablet 5   No current facility-administered medications for this visit.     Objective: BP 120/80 (BP Location: Left Arm, Patient Position: Sitting, Cuff Size: Normal)   Pulse 91   Temp 98.2 F (36.8 C) (Oral)   Ht 5\' 7"  (1.702 m)   Wt 159 lb 8 oz (72.3 kg)   SpO2 92%   BMI 24.98 kg/m  Gen: NAD, resting comfortably CV: RRR no murmurs rubs or gallops Lungs: CTAB no crackles, wheeze, rhonchi Ext: no edema Skin: warm, dry In right axilla there is a 3x3 cm abscess which spontaneously drains slightly- manipulated for further drainage and expression of purulent material, mild surrounding erythema Neuro: grossly normal, moves all extremities  Assessment/Plan:  Abscess right axilla S: Patient states he noted a knot about 7-10 day sago in right axilla. Progressed in size to over an inch diameter. Pain increased as increased. Tried warm compresses and eventually started to drain some last night but continues to be painful. No other treatments tried. Mild aching pain but was progressing until started to drain A/P: spontaneously drainage but further expressed purulence today. 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    Depression S: patient with phq2 of 0 today. He states feels somewhat flat on wellbutrin and doesn't tend to get upset. Given anxiety and prior tendency to get upset we agreed best to likely remain on medicine A/P: refilled med for 6 months- consider titrating back to 150mg  at follow up If continues to do so well  Hematuria S: noted on prior dipstick and apparently through DOT physical as well A/P: get urine microscopic today and refer to urology if still present   Orders Placed This Encounter  Procedures  . Urine Microscopic Only   Meds ordered this encounter  Medications  . buPROPion (WELLBUTRIN XL) 300 MG 24 hr tablet    Sig: Take 1 tablet (300 mg total) by mouth daily.    Dispense:  30 tablet    Refill:  5  . doxycycline (VIBRA-TABS) 100 MG tablet    Sig: Take 1 tablet (100 mg total) by mouth 2 (two) times daily.    Dispense:  14 tablet    Refill:  0    Return precautions advised.  Tana ConchStephen Hunter, MD

## 2016-02-01 NOTE — Assessment & Plan Note (Signed)
S: noted on prior dipstick and apparently through DOT physical as well A/P: get urine microscopic today and refer to urology if still present

## 2016-02-02 ENCOUNTER — Other Ambulatory Visit: Payer: Self-pay | Admitting: Family Medicine

## 2016-02-02 DIAGNOSIS — R319 Hematuria, unspecified: Secondary | ICD-10-CM

## 2016-02-02 LAB — URINALYSIS, MICROSCOPIC ONLY

## 2016-02-05 ENCOUNTER — Ambulatory Visit: Payer: PRIVATE HEALTH INSURANCE | Admitting: Family Medicine

## 2016-06-23 ENCOUNTER — Other Ambulatory Visit: Payer: Self-pay | Admitting: Family Medicine

## 2016-08-22 ENCOUNTER — Encounter: Payer: Self-pay | Admitting: Family Medicine

## 2016-08-22 ENCOUNTER — Ambulatory Visit (INDEPENDENT_AMBULATORY_CARE_PROVIDER_SITE_OTHER): Payer: PRIVATE HEALTH INSURANCE | Admitting: Family Medicine

## 2016-08-22 VITALS — BP 132/94 | HR 89 | Ht 67.0 in | Wt 170.4 lb

## 2016-08-22 DIAGNOSIS — K5900 Constipation, unspecified: Secondary | ICD-10-CM | POA: Diagnosis not present

## 2016-08-22 DIAGNOSIS — R1084 Generalized abdominal pain: Secondary | ICD-10-CM

## 2016-08-22 NOTE — Progress Notes (Signed)
Subjective:  Terry Tanner is a 51 y.o. year old very pleasant male patient who presents for/with See problem oriented charting ROS- no fever, chills, nausea, vomiting.  No melena. Every few months- blood in stool  Past Medical History-  Patient Active Problem List   Diagnosis Date Noted  . Hematuria 07/18/2014    Priority: High  . Depression 10/27/2009    Priority: Medium  . Constipation 10/18/2010    Priority: Low  . Elevated LFTs 10/18/2010    Priority: Low  . ERECTILE DYSFUNCTION 01/16/2007    Priority: Low    Medications- reviewed and updated Current Outpatient Prescriptions  Medication Sig Dispense Refill  . buPROPion (WELLBUTRIN XL) 300 MG 24 hr tablet TAKE 1 TABLET BY MOUTH EVERY DAY 30 tablet 5   Objective: BP (!) 132/94 (BP Location: Left Arm, Patient Position: Sitting, Cuff Size: Large)   Pulse 89   Ht  (1.702 m)   Wt 170 lb 6.4 oz (77.3 kg)   SpO2 96%   BMI 26.69 kg/m  Gen: NAD, resting comfortably CV: RRR no murmurs rubs or gallops Lungs: CTAB no crackles, wheeze, rhonchi Abdomen: soft/mild diffuse tenderness/nondistended/normal bowel sounds. No rebound or guarding.  Ext: no edema Rectal: normal tone, diffusely enlarged prostate, no masses or tenderness   Assessment/Plan:  Diffuse abdominal pain  Constipation, unspecified constipation type S: reports diffuse (centered in middle though) abdominal pain for 1 week. Rates up to 7/10 at worst. Has a bowel movement and stomach feels normal feel a whilebut then feels like it swells back up over next few days and eventually starts hurting again. Bowel movement once a week for years. Stool is hard and has to strain at times. Tried pepto bismol and alka seltzer which didn't help. Tried baking soda water.   Drinks 2 sixteen oz bottle a day.   Every few months notes blood in stool.   A/P: constipation long term but now with abdominal pain only relieved by bowel movement.  Rectal bleeding but has had for  years- Colonoscopy done in 2011 for rectal bleeding and was normal other than hemorrhoids. 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 We will start with miralax/conservative care. Update [place] in 3-4 weeks. No rectal stool burden or obvious hemorrhoids on exam. Does have some BPH and admits nocturia- advised him to schedule a physical in next few months and get psa "Start with miralax 1 capful daily for the next 3 weeks. Mix with a full glass of water at least 8 oz  If no bowel movement in 3 days can use 2 capfuls a day  Hold for diarrhea. May continue if just loose stool. If diarrhea- take a day or two off then restart miralax every other day  I want to increase your bowel movements to at least 2-3x a week.   Really glad you already had a colonoscopy because that drastically drops the risk of this being something more serious  Increase your water intake to 3-4 bottles a day.   I want you to update [place] by mychart or phone in 4 weeks. "  Return precautions advised.  Tana Conch, MD

## 2016-08-22 NOTE — Progress Notes (Signed)
Pre visit review using our clinic review tool, if applicable. No additional management support is needed unless otherwise documented below in the visit note. 

## 2016-08-22 NOTE — Patient Instructions (Addendum)
Start with miralax 1 capful daily for the next 3 weeks. Mix with a full glass of water at least 8 oz  If no bowel movement in 3 days can use 2 capfuls a day  Hold for diarrhea. May continue if just loose stool. If diarrhea- take a day or two off then restart miralax every other day  I want to increase your bowel movements to at least 2-3x a week.   Really glad you already had a colonoscopy because that drastically drops the risk of this being something more serious  Increase your water intake to 3-4 bottles a day.   I want you to update [place] by mychart or phone in 4 weeks.   Would schedule physical in next few months discused verbally.

## 2016-11-11 ENCOUNTER — Encounter: Payer: 59 | Admitting: Family Medicine

## 2016-11-11 DIAGNOSIS — Z0289 Encounter for other administrative examinations: Secondary | ICD-10-CM

## 2017-01-24 ENCOUNTER — Other Ambulatory Visit: Payer: Self-pay | Admitting: Family Medicine

## 2017-05-05 ENCOUNTER — Emergency Department (HOSPITAL_COMMUNITY)
Admission: EM | Admit: 2017-05-05 | Discharge: 2017-05-06 | Disposition: A | Discharge status: Home / Self Care | Payer: PRIVATE HEALTH INSURANCE | Attending: Emergency Medicine | Admitting: Emergency Medicine

## 2017-05-05 ENCOUNTER — Emergency Department (HOSPITAL_COMMUNITY): Payer: PRIVATE HEALTH INSURANCE

## 2017-05-05 ENCOUNTER — Encounter (HOSPITAL_COMMUNITY): Payer: Self-pay | Admitting: Emergency Medicine

## 2017-05-05 ENCOUNTER — Other Ambulatory Visit: Payer: Self-pay

## 2017-05-05 DIAGNOSIS — R079 Chest pain, unspecified: Secondary | ICD-10-CM

## 2017-05-05 DIAGNOSIS — R0602 Shortness of breath: Secondary | ICD-10-CM | POA: Diagnosis not present

## 2017-05-05 DIAGNOSIS — R0789 Other chest pain: Secondary | ICD-10-CM | POA: Insufficient documentation

## 2017-05-05 DIAGNOSIS — Z79899 Other long term (current) drug therapy: Secondary | ICD-10-CM | POA: Insufficient documentation

## 2017-05-05 LAB — I-STAT TROPONIN, ED
TROPONIN I, POC: 0.01 ng/mL (ref 0.00–0.08)
Troponin i, poc: 0.01 ng/mL (ref 0.00–0.08)

## 2017-05-05 LAB — BASIC METABOLIC PANEL
Anion gap: 7 (ref 5–15)
BUN: 6 mg/dL (ref 6–20)
CHLORIDE: 103 mmol/L (ref 101–111)
CO2: 28 mmol/L (ref 22–32)
Calcium: 9.2 mg/dL (ref 8.9–10.3)
Creatinine, Ser: 1.11 mg/dL (ref 0.61–1.24)
GFR calc Af Amer: >60 mL/min (ref >60)
GFR calc non Af Amer: >60 mL/min (ref >60)
GLUCOSE: 103 mg/dL — AB (ref 65–99)
POTASSIUM: 3.9 mmol/L (ref 3.5–5.1)
Sodium: 138 mmol/L (ref 135–145)

## 2017-05-05 LAB — CBC
HEMATOCRIT: 40.3 % (ref 39.0–52.0)
Hemoglobin: 13.9 g/dL (ref 13.0–17.0)
MCH: 31.1 pg (ref 26.0–34.0)
MCHC: 34.5 g/dL (ref 30.0–36.0)
MCV: 90.2 fL (ref 78.0–100.0)
Platelets: 460 10*3/uL — ABNORMAL HIGH (ref 150–400)
RBC: 4.47 MIL/uL (ref 4.22–5.81)
RDW: 13.7 % (ref 11.5–15.5)
WBC: 5.1 10*3/uL (ref 4.0–10.5)

## 2017-05-05 NOTE — ED Triage Notes (Signed)
Pt c/o left chest pain onset 2 days ago while at rest.  Pt st's now pain is radiating into left arm.  Also pt st's is feels short of breath and feels like his heart is not beating regularly

## 2017-05-05 NOTE — ED Provider Notes (Signed)
[facility] EMERGENCY DEPARTMENT Provider Note   CSN: [id number] Arrival date & time: 05/05/17  1819     History   Chief Complaint Chief Complaint  Patient presents with  . Chest Pain    HPI Terry Tanner is a 51 y.o. male.  HPI Terry Tanner is a 51 y.o. male with history of anemia, presents to emergency department complaining of left-sided chest pain.  Patient reports pain onset several days ago.  He states pain radiates into the left arm.  He also reports some shortness of breath.  He states pain is worsened when he breathes.  He denies worsening pain or shortness of breath on exertion.  He denies any abdominal pain.  No nausea or vomiting.  He states his left arm feels "tingly."  He has not tried any medications for this.  No history of the same.  Denies history of hypertension, diabetes, he is a not a smoker, no history of cardiac problems.  Denies any family history of heart problems.  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  Denies any swelling or pain in his lower extremities.  Denies any fever or chills.  No cough. Past Medical History:  Diagnosis Date  . Anemia 09/19/2010   History of anemia reported. 2012 and 2013 non anemia.    . Depression   . Hematuria 08/29/2009   2011- per problem list. Patient denies history.     . Kidney stones    ? cause hematuria reported in problem list    Patient Active Problem List   Diagnosis Date Noted  . Hematuria 07/18/2014  . Constipation 10/18/2010  . Elevated LFTs 10/18/2010  . Depression 10/27/2009  . ERECTILE DYSFUNCTION 01/16/2007    Past Surgical History:  Procedure Laterality Date  . CHOLECYSTECTOMY         Home Medications    Prior to Admission medications   Medication Sig Start Date End Date Taking? Authorizing Provider  buPROPion (WELLBUTRIN XL) 300 MG 24 hr tablet TAKE 1 TABLET BY MOUTH EVERY DAY 01/27/17   Shelva Majestic, MD    Family History Family  History  Problem Relation Age of Onset  . Alcohol abuse Mother   . Cancer Mother        liver  . Diabetes Sister   . Hypertension Father   . Seizures Brother   . Anemia Daughter     Social History Social History   Tobacco Use  . Smoking status: Never Smoker  . Smokeless tobacco: Never Used  Substance Use Topics  . Alcohol use: No  . Drug use: No     Allergies   Patient has no known allergies.   Review of Systems Review of Systems  Constitutional: Negative for chills and fever.  Respiratory: Positive for chest tightness and shortness of breath. Negative for cough.   Cardiovascular: Positive for chest pain. Negative for palpitations and leg swelling.  Gastrointestinal: Negative for abdominal distention, abdominal pain, diarrhea, nausea and vomiting.  Genitourinary: Negative for dysuria, frequency, hematuria and urgency.  Musculoskeletal: Positive for arthralgias and myalgias. Negative for neck pain and neck stiffness.  Skin: Negative for rash.  Allergic/Immunologic: Negative for immunocompromised state.  Neurological: Negative for dizziness, weakness, light-headedness, numbness and headaches.     Physical Exam Updated Vital Signs BP (!) 146/102 (BP Location: Right Arm)   Pulse 77   Temp 98.5 F (36.9 C) (Oral)   Resp 12   Ht 5\' 7"  (  1.702 m)   Wt 77.1 kg (170 lb)   SpO2 100%   BMI 26.63 kg/m   Physical Exam  Constitutional: He appears well-developed and well-nourished. No distress.  HENT:  Head: Normocephalic and atraumatic.  Eyes: Conjunctivae are normal.  Neck: Neck supple.  Cardiovascular: Normal rate, regular rhythm and normal pulses. Exam reveals no friction rub.  No murmur heard.  No systolic murmur is present.  No diastolic murmur is present. Pulses:      Radial pulses are 2+ on the right side, and 2+ on the left side.  No chest wall tenderness  Pulmonary/Chest: Effort normal. No respiratory distress. He has no wheezes. He has no rales.    Abdominal: Soft. Bowel sounds are normal. He exhibits no distension. There is no tenderness. There is no rebound.  Musculoskeletal: He exhibits no edema.  Neurological: He is alert.  Skin: Skin is warm and dry.  Nursing note and vitals reviewed.    ED Treatments / Results  Labs (all labs ordered are listed, but only abnormal results are displayed) Labs Reviewed  BASIC METABOLIC PANEL - Abnormal; Notable for the following components:      Result Value   Glucose, Bld 103 (*)    All other components within normal limits  CBC - Abnormal; Notable for the following components:   Platelets 460 (*)    All other components within normal limits  D-DIMER, QUANTITATIVE (NOT AT [facility])  I-STAT TROPONIN, ED  I-STAT TROPONIN, ED  I-STAT TROPONIN, ED    EKG  EKG Interpretation  Date/Time:  Monday May 05 2017 18:25:06 EST Ventricular Rate:  85 PR Interval:  116 QRS Duration: 86 QT Interval:  348 QTC Calculation: 414 R Axis:   77 Text Interpretation:  Normal sinus rhythm Normal ECG Confirmed by Rochele RaringWard, Kristen [phone]([id number]) on 05/06/2017 12:28:38 AM       Radiology Dg Chest 2 View  Result Date: 05/05/2017 CLINICAL DATA:  Shortness of breath and left-sided chest pain radiating into left arm. EXAM: CHEST  2 VIEW COMPARISON:  None. FINDINGS: The heart size and mediastinal contours are within normal limits. There is no evidence of pulmonary edema, consolidation, pneumothorax, nodule or pleural fluid. The visualized skeletal structures are unremarkable. IMPRESSION: No active cardiopulmonary disease. Electronically Signed   By: Irish LackGlenn  Yamagata M.D.   On: 05/05/2017 19:06    Procedures Procedures (including critical care time)  Medications Ordered in ED Medications - No data to display   Initial Impression / Assessment and Plan / ED Course  I have reviewed the triage vital signs and the nursing notes.  Pertinent labs & imaging results that were available during my care of the patient  were reviewed by me and considered in my medical decision making (see chart for details).     Patient with left-sided chest pain radiating into the left arm with some "tingling sensation in the hand."  No history of the same.  Patient has a heart score of 1.  Low risk.  He has had 2 troponins already done in triage 4-1/2 hours apart, both negative.  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  This puts him under risk for a PE.  I will get a d-dimer for screening.  His vital signs are normal.  3:14 AM 2 troponins negative.  Negative d-dimer.  Vital signs remained normal.  Low heart score, doubt  ACS, however discussed close follow-up with PCP for stress test.  Chest x-ray negative.  Patient is stable for discharge home, advised to take NSAIDs.  Follow-up with family doctor.  Return precautions discussed.  Vitals:   05/06/17 0100 05/06/17 0200 05/06/17 0230 05/06/17 0300  BP: 127/88 (!) 128/92 123/86 101/69  Pulse: 80 77 71 70  Resp: 19 15 14 12   Temp:      TempSrc:      SpO2: 96% 97% 98% 99%  Weight:      Height:         Final Clinical Impressions(s) / ED Diagnoses   Final diagnoses:  Nonspecific chest pain    ED Discharge Orders    None       Jaynie CrumbleKirichenko, Elisa Sorlie, PA-C 05/06/17 0316    Ward, Layla MawKristen N, DO 05/06/17 [phone]

## 2017-05-06 LAB — D-DIMER, QUANTITATIVE: D-Dimer, Quant: <0.27 ug{FEU}/mL (ref 0.00–0.50)

## 2017-05-06 MED ORDER — GI COCKTAIL ~~LOC~~
30.0000 mL | Freq: Once | ORAL | Status: AC
  Administered 2017-05-06: 30 mL via ORAL
  Filled 2017-05-06: qty 30

## 2017-05-06 NOTE — ED Notes (Signed)
Pt unable to sign. Pt verbally consents to discharge

## 2017-05-06 NOTE — Discharge Instructions (Signed)
Take ibuprofen or Tylenol for pain.  Avoid any strenuous activity.  Follow-up with family doctor for recheck and possible stress test.  Return if worsening symptoms.

## 2017-06-13 ENCOUNTER — Telehealth: Payer: Self-pay | Admitting: Family Medicine

## 2017-06-13 NOTE — Telephone Encounter (Unsigned)
Copied from CRM [phone]. Topic: Inquiry >> Jun 13, 2017 11:05 AM Raquel SarnaHayes, Teresa G wrote: Pt is taking Wellbutrin 300 mg.  He wants to know if Dr. Durene CalHunter advise if he can be put on another medication due to the med is affecting his memory. Please call pt back to discuss asap.

## 2017-06-13 NOTE — Telephone Encounter (Signed)
Dr. Hunter patient.  

## 2017-06-13 NOTE — Telephone Encounter (Signed)
Called pt regarding the symptom he is having with his Wellbutrin. He states he feels like it is affecting memory.  His LOV was 08/2016 and he needs to follow up with a office visit. He is agreeable to one and waiting for a call back from office.

## 2017-06-13 NOTE — Telephone Encounter (Signed)
See note

## 2017-06-16 NOTE — Telephone Encounter (Signed)
Please call pt and schedule appointment this week to see Dr, Durene CalHunter.

## 2017-06-16 NOTE — Telephone Encounter (Signed)
Next available appointment 07/10/17. Please advise.

## 2017-06-18 NOTE — Telephone Encounter (Signed)
Please use same day appointment slots for this week or next to get pt in.

## 2017-06-18 NOTE — Telephone Encounter (Signed)
Patient has been scheduled

## 2017-06-23 ENCOUNTER — Encounter: Payer: Self-pay | Admitting: Family Medicine

## 2017-06-23 ENCOUNTER — Ambulatory Visit: Payer: PRIVATE HEALTH INSURANCE | Admitting: Family Medicine

## 2017-06-23 VITALS — BP 118/82 | HR 85 | Temp 98.3°F | Ht 67.0 in | Wt 176.8 lb

## 2017-06-23 DIAGNOSIS — K625 Hemorrhage of anus and rectum: Secondary | ICD-10-CM

## 2017-06-23 DIAGNOSIS — K5900 Constipation, unspecified: Secondary | ICD-10-CM

## 2017-06-23 DIAGNOSIS — F3342 Major depressive disorder, recurrent, in full remission: Secondary | ICD-10-CM | POA: Diagnosis not present

## 2017-06-23 DIAGNOSIS — R3121 Asymptomatic microscopic hematuria: Secondary | ICD-10-CM | POA: Diagnosis not present

## 2017-06-23 MED ORDER — BUPROPION HCL ER (XL) 150 MG PO TB24: 150.0000 mg | ORAL_TABLET | Freq: Every day | ORAL | 5 refills | Status: DC

## 2017-06-23 MED ORDER — ESCITALOPRAM OXALATE 10 MG PO TABS: 10.0000 mg | ORAL_TABLET | Freq: Every day | ORAL | 5 refills | Status: DC

## 2017-06-23 NOTE — Assessment & Plan Note (Signed)
S: Patient is having reasonable control of depression with PHQ9 of 1 today. GAD7 of 4 as well- as has experienced anxiety in the past.   With that being said patient is worried about potential side effects. atient states mild memory issues on wellbutrin (per uptodate <3% of patients with this issues). Also wishes he had more energy though we discussed may not be related to his wellbutrin as to low energy A/P: reasonable control but with possible SE. Previously on Prozac not clear why it did not work. Previously on Zoloft (sexual SE, upset stomach).   We will trial lexapro 10mg  and reduce wellbutrin to 150mg  XL- hopeful with going down on wellbutrin that we can avoid SE of higher doses of both SSRI and atypical antidepressant. Advised 2 month follow up but sooner if worsening symptoms or side

## 2017-06-23 NOTE — Progress Notes (Signed)
Subjective:  Terry Tanner is a 52 y.o. year old very pleasant male patient who presents for/with See problem oriented charting ROS- mild memory issues. No unintentional weight loss- in fact has gained some. Gets right sided abdominal pain and tightness with bowel movements. No fever or chills   Past Medical History-  Patient Active Problem List   Diagnosis Date Noted  . Depression 10/27/2009    Priority: Medium  . Hematuria 07/18/2014    Priority: Low  . Constipation 10/18/2010    Priority: Low  . Elevated LFTs 10/18/2010    Priority: Low  . ERECTILE DYSFUNCTION 01/16/2007    Priority: Low    Medications- reviewed and updated Current Outpatient Medications  Medication Sig Dispense Refill  . buPROPion (WELLBUTRIN XL) 300 MG 24 hr tablet TAKE 1 TABLET BY MOUTH EVERY DAY 30 tablet 5   No current facility-administered medications for this visit.     Objective: BP 118/82 (BP Location: Left Arm, Patient Position: Sitting, Cuff Size: Large)   Pulse 85   Temp 98.3 F (36.8 C) (Oral)   Ht 5\' 7"  (1.702 m)   Wt 176 lb 12.8 oz (80.2 kg)   SpO2 98%   BMI 27.69 kg/m  Gen: NAD, resting comfortably CV: RRR no murmurs rubs or gallops Lungs: CTAB no crackles, wheeze, rhonchi Abdomen: soft/nontender/nondistended/normal bowel sounds. No rebound or guarding.  Ext: no edema Skin: warm, dry  Assessment/Plan:  Hematuria Patient was referred to urology. He states he had a ct scan which showed kidney stones. They had discussed cystoscope but with no stones no further workup was suggested.   Depression S: Patient is having reasonable control of depression with PHQ9 of 1 today. GAD7 of 4 as well- as has experienced anxiety in the past.   With that being said patient is worried about potential side effects. atient states mild memory issues on wellbutrin (per uptodate <3% of patients with this issues). Also wishes he had more energy though we discussed may not be related to his wellbutrin as  to low energy A/P: reasonable control but with possible SE. Previously on Prozac not clear why it did not work. Previously on Zoloft (sexual SE, upset stomach).   We will trial lexapro 10mg  and reduce wellbutrin to 150mg  XL- hopeful with going down on wellbutrin that we can avoid SE of higher doses of both SSRI and atypical antidepressant. Advised 2 month follow up but sooner if worsening symptoms or side   Constipation Rectal bleeding S: Patient with continued pain along with BMs- this did not improve with daily miralax. Marland Kitchen. He states on right side of abdomen it feels extremely tight when he has a bowel movement. He continues to have intermittent blood in stool- previously thought due to hemorrhoids but does notice it down in bowel and not just with wiping. Had colonoscopy in 2011 A/P: will refer to GI to see if with rectal bleeding if they recommend another colonoscopy at this time. I am not sure if we will find a clear answer to right sided abdominal tenderness with bowel movement to be honest but he thinks the colonoscopy would reassure him. He asks about cancer screening and I advised he return for physical and we can get PSA and rectal exam as well  Future Appointments  Date Time Provider Department Center  08/25/2017  9:45 AM Durene CalHunter, Aldine ContesStephen O, MD [facility]   Lab/Order associations: Rectal bleeding - Plan: Ambulatory referral to Gastroenterology  Meds ordered this encounter  Medications  .  escitalopram (LEXAPRO) 10 MG tablet    Sig: Take 1 tablet (10 mg total) by mouth daily.    Dispense:  30 tablet    Refill:  5  . buPROPion (WELLBUTRIN XL) 150 MG 24 hr tablet    Sig: Take 1 tablet (150 mg total) by mouth daily.    Dispense:  30 tablet    Refill:  5   Time Stamp The duration of face-to-face time during this visit was greater than 25 minutes. Greater than 50% of this time was spent in counseling, explanation of diagnosis, planning of further management, and/or coordination of care  including discussing different antidepressant options and potential side effects.     Return precautions advised.  Tana Conch, MD

## 2017-06-23 NOTE — Assessment & Plan Note (Signed)
S: Patient with continued pain along with BMs- this did not improve with daily miralax. Marland Kitchen. He states on right side of abdomen it feels extremely tight when he has a bowel movement. He continues to have intermittent blood in stool- previously thought due to hemorrhoids but does notice it down in bowel and not just with wiping. Had colonoscopy in 2011 A/P: will refer to GI to see if with rectal bleeding if they recommend another colonoscopy at this time. I am not sure if we will find a clear answer to right sided abdominal tenderness with bowel movement to be honest but he thinks the colonoscopy would reassure him. He asks about cancer screening and I advised he return for physical and we can get PSA and rectal exam as well

## 2017-06-23 NOTE — Assessment & Plan Note (Signed)
Patient was referred to urology. He states he had a ct scan which showed kidney stones. They had discussed cystoscope but with no stones no further workup was suggested.

## 2017-06-23 NOTE — Patient Instructions (Addendum)
Reduce wellbutrin to 150mg  a day  Start lexapro/escitalopram 10mg  daily  See me in 2 months  Please schedule next available physical as well within 4-6 months  We will call you within a week or two about your referral to GI for rectal bleeidng. If you do not hear within 3 weeks, give us a call.

## 2017-06-24 ENCOUNTER — Encounter: Payer: Self-pay | Admitting: Gastroenterology

## 2017-08-18 ENCOUNTER — Ambulatory Visit: Payer: PRIVATE HEALTH INSURANCE | Admitting: Gastroenterology

## 2017-08-25 ENCOUNTER — Ambulatory Visit: Payer: PRIVATE HEALTH INSURANCE | Admitting: Family Medicine

## 2017-09-12 ENCOUNTER — Telehealth: Payer: Self-pay

## 2017-09-12 ENCOUNTER — Encounter: Payer: Self-pay | Admitting: Family Medicine

## 2017-09-12 NOTE — Telephone Encounter (Signed)
Called patient and left a voicemail message asking patient to return my call. I did state that I had sent patient a message via My Chart.   I called in reference to the following letter received from GI:  We have attempted to schedule the recommended office visit but have not been able to schedule because:  ___ The patient was not available by phone and/or has not returned our calls.  _x_ The patient cancelled appointment and has not rescheduled as of yet.  We appreciate the referral and hope that we will have the opportunity to treat this patient in the future.    Sincerely,    [facility] [phone]

## 2017-09-12 NOTE — Telephone Encounter (Signed)
Thanks for calling-he really needs to follow-up with gastroenterology considering rectal bleeding he has had

## 2017-09-15 ENCOUNTER — Telehealth: Payer: Self-pay

## 2017-09-15 NOTE — Telephone Encounter (Signed)
Called patient and left a message to call office back.  

## 2018-03-04 ENCOUNTER — Encounter: Payer: Self-pay | Admitting: Gastroenterology

## 2018-06-15 ENCOUNTER — Encounter: Payer: Self-pay | Admitting: Gastroenterology

## 2018-07-06 ENCOUNTER — Encounter: Payer: Self-pay | Admitting: Gastroenterology

## 2018-07-06 ENCOUNTER — Ambulatory Visit: Payer: BLUE CROSS/BLUE SHIELD | Admitting: Gastroenterology

## 2018-07-06 VITALS — BP 102/74 | HR 80 | Ht 67.0 in | Wt 179.4 lb

## 2018-07-06 DIAGNOSIS — R109 Unspecified abdominal pain: Secondary | ICD-10-CM | POA: Diagnosis not present

## 2018-07-06 DIAGNOSIS — K5900 Constipation, unspecified: Secondary | ICD-10-CM | POA: Diagnosis not present

## 2018-07-06 MED ORDER — PEG 3350-KCL-NA BICARB-NACL 420 G PO SOLR: 4000.0000 mL | ORAL | 0 refills | Status: AC

## 2018-07-06 NOTE — Patient Instructions (Addendum)
Miralax purge ASAP. Then two doses of miralax every day.  You will be set up for a colonoscopy for chronic constipation, abd discomfort.  Dr Christella Hartigan recommends that you complete a bowel purge (to clean out your bowels). Please do the following: Purchase a bottle of Miralax over the counter as well as a box of 5 mg dulcolax tablets. Take 4 dulcolax tablets. Wait 1 hour. You will then drink 6-8 capfuls of Miralax mixed in an adequate amount of water/juice/gatorade (you may choose which of these liquids to drink) over the next 2-3 hours. You should expect results within 1 to 6 hours after completing the bowel purge.  Thank you for entrusting me with your care and choosing [facility].  Dr Christella Hartigan

## 2018-07-06 NOTE — Progress Notes (Signed)
HPI: This is a very pleasant 53 year old man who was referred to me by Shelva Majestic, MD  to evaluate chronic constipation, bloating, generalized abdominal discomforts.    Chief complaint is chronic constipation, bloating, generalized abdominal discomforts  For at least the past several years he has had significant difficulty with his bowels.  He has a bowel movement 2 or 3 times a month and only with the aid of Ex-Lax or MiraLAX or other agents that he buys over-the-counter.  Lately he has been taking some sort of "digestive aid".  He has generalized abdominal discomforts, feels he has to push in his right upper quadrant at times to get his bowels moving specially when they are very backed up.  He has seen blood on occasion rectally, usually when he has to really push and strain.  Does see blood in stool periodically.  He's up 30 pounds, over 2 years.  Previously he was bothered by heartburn and mild dysphasia but those are completely resolved since he started taking over-the-counter Nexium once daily.  He was seen here in our office by Gunnar Fusi about 8 years ago.  The year prior to that he was evaluated by me with colonoscopy and upper endoscopy for rectal bleeding and generalized abdominal pain.  Colonoscopy June 2011 Dr. Christella Hartigan was normal except for external hemorrhoids.  EGD June 2011 Dr. Christella Hartigan was normal.  After his 2012 office visit with Gunnar Fusi he was advised to follow-up with me in the office about 3 months later.  We have not heard from him since then.   Old Data Reviewed:     Review of systems: Pertinent positive and negative review of systems were noted in the above HPI section. All other review negative.   Past Medical History:  Diagnosis Date  . Anemia 09/19/2010   History of anemia reported. 2012 and 2013 non anemia.    . Depression   . Hematuria 08/29/2009   2011- per problem list. Patient denies history.     . Kidney stones    ? cause hematuria reported in problem  list    Past Surgical History:  Procedure Laterality Date  . CHOLECYSTECTOMY      Current Outpatient Medications  Medication Sig Dispense Refill  . esomeprazole (NEXIUM) 10 MG packet Take 10 mg by mouth daily before breakfast.    . Probiotic Product (DIGESTIVE ADVANTAGE) CAPS Take by mouth 2 (two) times daily.     No current facility-administered medications for this visit.     Allergies as of 07/06/2018  . (No Known Allergies)    Family History  Problem Relation Age of Onset  . Alcohol abuse Mother   . Cancer Mother        liver  . Diabetes Sister   . Hypertension Father   . Seizures Brother   . Anemia Daughter     Social History   Socioeconomic History  . Marital status: Married    Spouse name: Not on file  . Number of children: 2  . Years of education: Not on file  . Highest education level: Not on file  Occupational History  . Occupation: DRIVER    Employer: [employer]  Social Needs  . Financial resource strain: Not on file  . Food insecurity:    Worry: Not on file    Inability: Not on file  . Transportation needs:    Medical: Not on file    Non-medical: Not on file  Tobacco Use  . Smoking status:  Never Smoker  . Smokeless tobacco: Never Used  Substance and Sexual Activity  . Alcohol use: No  . Drug use: No  . Sexual activity: Yes    Partners: Female  Lifestyle  . Physical activity:    Days per week: Not on file    Minutes per session: Not on file  . Stress: Not on file  Relationships  . Social connections:    Talks on phone: Not on file    Gets together: Not on file    Attends religious service: Not on file    Active member of club or organization: Not on file    Attends meetings of clubs or organizations: Not on file    Relationship status: Not on file  . Intimate partner violence:    Fear of current or ex partner: Not on file    Emotionally abused: Not on file    Physically abused: Not on file    Forced sexual activity: Not on file   Other Topics Concern  . Not on file  Social History Narrative   Married. 2 children. No grandkids.       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[employer], English as a second language teacher, movies     Physical Exam: BP 102/74   Pulse 80   Ht 5\' 7"  (1.702 m)   Wt 179 lb 6 oz (81.4 kg)   BMI 28.09 kg/m  Constitutional: generally well-appearing Psychiatric: alert and oriented x3 Eyes: extraocular movements intact Mouth: oral pharynx moist, no lesions Neck: supple no lymphadenopathy Cardiovascular: heart regular rate and rhythm Lungs: clear to auscultation bilaterally Abdomen: soft, nontender, nondistended, no obvious ascites, no peritoneal signs, normal bowel sounds Extremities: no lower extremity edema bilaterally Skin: no lesions on visible extremities   Assessment and plan: 53 y.o. male with significant chronic constipation, generalized abdominal pains, minor rectal bleeding  I think is most significant issue is his chronic constipation and I do believe that it is leading to a lot of his abdominal discomforts, his bloating.  I recommended a bowel purge with MiraLAX and then he should start 2 doses of MiraLAX once daily.  I recommended a colonoscopy at his soonest convenience to exclude structural causes which I think is unlikely.  He has been gaining weight and so the chance of neoplasm is probably quite small.  He understands that it may take some time to get his constipation under control including adding fiber supplements, including a trial of either Linzess or amitiza.   Please see the "Patient Instructions" section for addition details about the plan.   Rob Bunting, MD  Gastroenterology 07/06/2018, 10:35 AM  Cc: Shelva Majestic, MD

## 2018-07-13 ENCOUNTER — Encounter: Payer: BLUE CROSS/BLUE SHIELD | Admitting: Gastroenterology

## 2018-10-27 IMAGING — DX DG CHEST 2V
2 series · 2 of 2 positions shown · non-contrast
Comparison: None.

CLINICAL DATA: Shortness of breath and left-sided chest pain
radiating into left arm.

EXAM:
CHEST  2 VIEW

[chest pa]
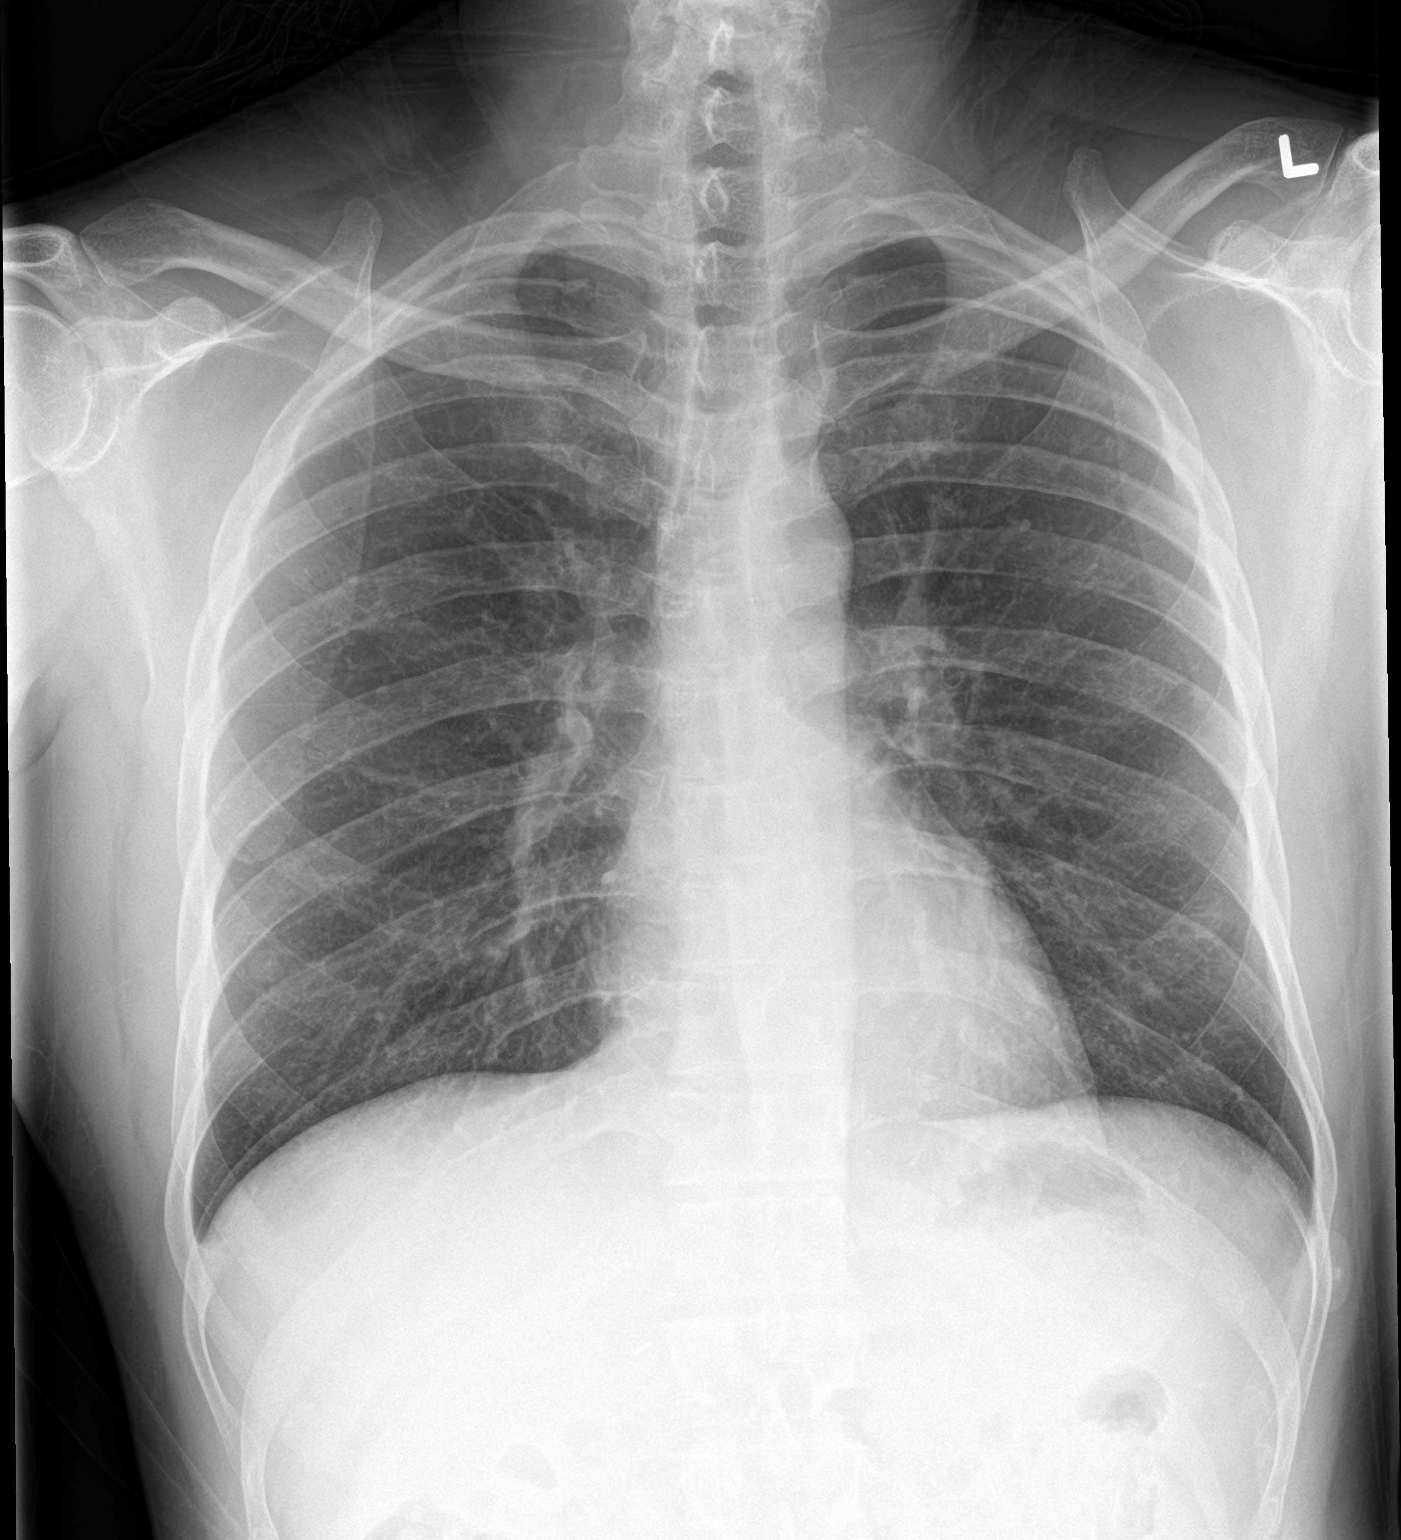

[chest lat]
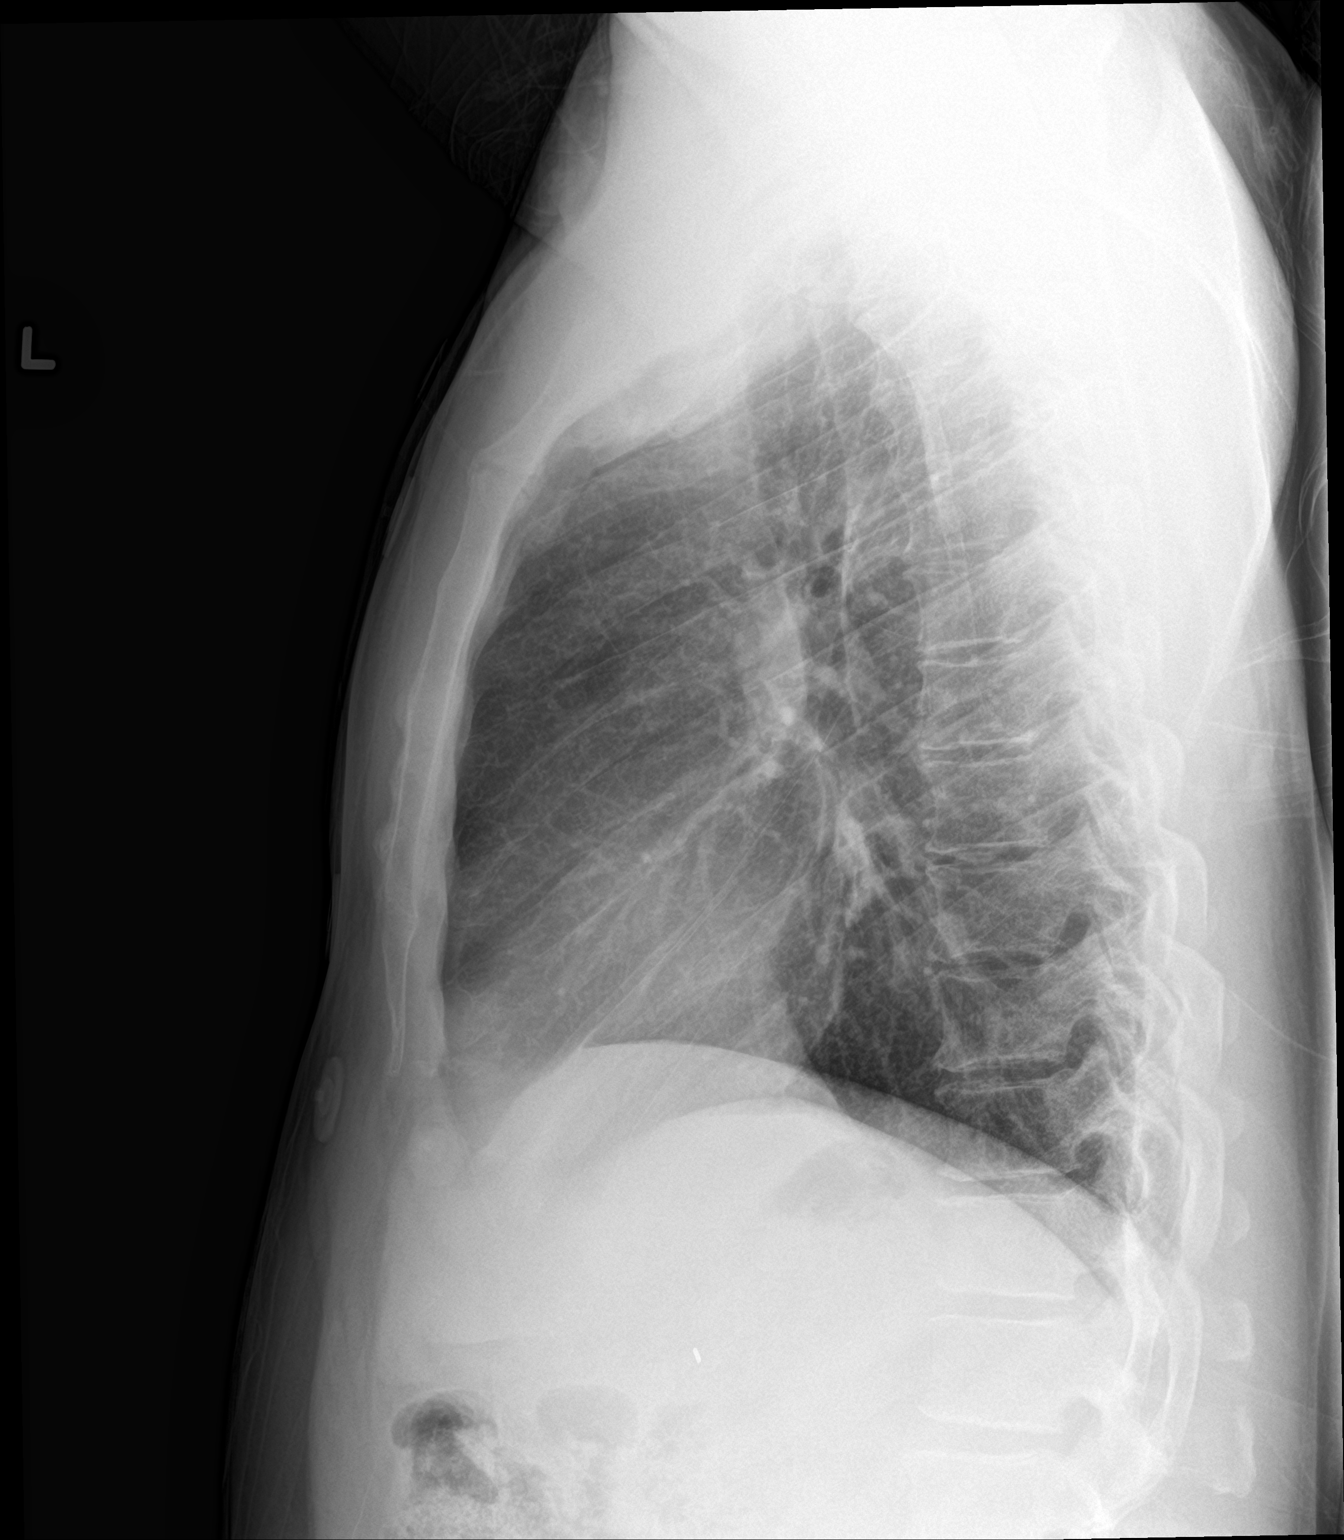

[2 of 2 positions shown; findings below may reference images not displayed]

FINDINGS: The heart size and mediastinal contours are within normal limits.
There is no evidence of pulmonary edema, consolidation,
pneumothorax, nodule or pleural fluid. The visualized skeletal
structures are unremarkable.
IMPRESSION: No active cardiopulmonary disease.

## 2019-01-10 ENCOUNTER — Other Ambulatory Visit: Payer: Self-pay | Admitting: Family Medicine

## 2019-01-11 NOTE — Telephone Encounter (Signed)
Last OV 06/23/2017.   Pt needs OV, please call to schedule. Thanks!  

## 2019-01-12 NOTE — Telephone Encounter (Signed)
lvm for patient to call back and schedule appt.

## 2019-01-22 ENCOUNTER — Telehealth: Payer: Self-pay | Admitting: Family Medicine

## 2019-01-22 NOTE — Telephone Encounter (Signed)
LVM FOR PATIENT TO CALL BACK AND SCHEDULE APPT. °

## 2019-01-22 NOTE — Telephone Encounter (Signed)
Last OV 06/23/2017.   Pt needs OV, please call to schedule. Thanks!

## 2019-03-11 NOTE — Telephone Encounter (Signed)
LVM for the patient to call office to schedule. We can try to work into a day with multiple same days

## 2019-03-11 NOTE — Telephone Encounter (Signed)
Patient requesting call back to discuss potentially working him in for med refill OV. First available OV is in late November.

## 2019-03-11 NOTE — Telephone Encounter (Signed)
See note
